# Patient Record
Sex: Female | Born: 1950 | Race: White | Hispanic: No | Smoking: Never smoker
Health system: Southern US, Community
[De-identification: ages and names within clinical notes are randomized; demographics above are authoritative.]

## PROBLEM LIST (undated history)

## (undated) DIAGNOSIS — Z5189 Encounter for other specified aftercare: Secondary | ICD-10-CM

## (undated) DIAGNOSIS — E66811 Obesity, class 1: Secondary | ICD-10-CM

## (undated) DIAGNOSIS — R011 Cardiac murmur, unspecified: Secondary | ICD-10-CM

## (undated) DIAGNOSIS — F330 Major depressive disorder, recurrent, mild: Secondary | ICD-10-CM

## (undated) DIAGNOSIS — Z87442 Personal history of urinary calculi: Secondary | ICD-10-CM

## (undated) DIAGNOSIS — E669 Obesity, unspecified: Secondary | ICD-10-CM

## (undated) DIAGNOSIS — N200 Calculus of kidney: Secondary | ICD-10-CM

## (undated) DIAGNOSIS — E785 Hyperlipidemia, unspecified: Secondary | ICD-10-CM

## (undated) DIAGNOSIS — R519 Headache, unspecified: Secondary | ICD-10-CM

## (undated) DIAGNOSIS — G479 Sleep disorder, unspecified: Secondary | ICD-10-CM

## (undated) DIAGNOSIS — F419 Anxiety disorder, unspecified: Secondary | ICD-10-CM

## (undated) DIAGNOSIS — J301 Allergic rhinitis due to pollen: Secondary | ICD-10-CM

## (undated) DIAGNOSIS — C801 Malignant (primary) neoplasm, unspecified: Secondary | ICD-10-CM

## (undated) DIAGNOSIS — H269 Unspecified cataract: Secondary | ICD-10-CM

## (undated) HISTORY — DX: Obesity, class 1: E66.811

## (undated) HISTORY — DX: Anxiety disorder, unspecified: F41.9

## (undated) HISTORY — DX: Allergic rhinitis due to pollen: J30.1

## (undated) HISTORY — PX: BREAST SURGERY: SHX581

## (undated) HISTORY — DX: Cardiac murmur, unspecified: R01.1

## (undated) HISTORY — PX: APPENDECTOMY: SHX54

## (undated) HISTORY — DX: Major depressive disorder, recurrent, mild: F33.0

## (undated) HISTORY — DX: Hyperlipidemia, unspecified: E78.5

## (undated) HISTORY — DX: Obesity, unspecified: E66.9

## (undated) HISTORY — DX: Encounter for other specified aftercare: Z51.89

## (undated) HISTORY — PX: COLONOSCOPY W/ POLYPECTOMY: SHX1380

## (undated) HISTORY — DX: Sleep disorder, unspecified: G47.9

## (undated) HISTORY — DX: Calculus of kidney: N20.0

## (undated) HISTORY — PX: EYE SURGERY: SHX253

## (undated) HISTORY — DX: Unspecified cataract: H26.9

## (undated) HISTORY — PX: COLON SURGERY: SHX602

---

## 1982-05-02 DIAGNOSIS — Z85038 Personal history of other malignant neoplasm of large intestine: Secondary | ICD-10-CM

## 1982-05-02 DIAGNOSIS — C801 Malignant (primary) neoplasm, unspecified: Secondary | ICD-10-CM

## 1982-05-02 HISTORY — PX: COLON SURGERY: SHX602

## 1982-05-02 HISTORY — DX: Personal history of other malignant neoplasm of large intestine: Z85.038

## 1982-05-02 HISTORY — DX: Malignant (primary) neoplasm, unspecified: C80.1

## 1997-05-02 HISTORY — PX: BREAST SURGERY: SHX581

## 1997-05-02 HISTORY — PX: BREAST EXCISIONAL BIOPSY: SUR124

## 1997-09-03 ENCOUNTER — Other Ambulatory Visit: Admission: RE | Admit: 1997-09-03 | Discharge: 1997-09-03 | Payer: Self-pay | Admitting: Obstetrics and Gynecology

## 1997-09-15 ENCOUNTER — Ambulatory Visit (HOSPITAL_COMMUNITY): Admission: RE | Admit: 1997-09-15 | Discharge: 1997-09-15 | Payer: Self-pay | Admitting: Obstetrics and Gynecology

## 1997-10-23 ENCOUNTER — Ambulatory Visit (HOSPITAL_COMMUNITY): Admission: RE | Admit: 1997-10-23 | Discharge: 1997-10-23 | Payer: Self-pay | Admitting: General Surgery

## 1997-10-31 ENCOUNTER — Ambulatory Visit (HOSPITAL_COMMUNITY): Admission: RE | Admit: 1997-10-31 | Discharge: 1997-10-31 | Payer: Self-pay | Admitting: Obstetrics and Gynecology

## 1997-11-05 ENCOUNTER — Ambulatory Visit (HOSPITAL_COMMUNITY): Admission: RE | Admit: 1997-11-05 | Discharge: 1997-11-05 | Payer: Self-pay | Admitting: General Surgery

## 1998-09-09 ENCOUNTER — Other Ambulatory Visit: Admission: RE | Admit: 1998-09-09 | Discharge: 1998-09-09 | Payer: Self-pay | Admitting: Obstetrics and Gynecology

## 1999-02-05 ENCOUNTER — Ambulatory Visit (HOSPITAL_COMMUNITY): Admission: RE | Admit: 1999-02-05 | Discharge: 1999-02-05 | Payer: Self-pay | Admitting: Gastroenterology

## 1999-10-06 ENCOUNTER — Other Ambulatory Visit: Admission: RE | Admit: 1999-10-06 | Discharge: 1999-10-06 | Payer: Self-pay | Admitting: Obstetrics and Gynecology

## 2000-07-19 ENCOUNTER — Ambulatory Visit (HOSPITAL_COMMUNITY): Admission: RE | Admit: 2000-07-19 | Discharge: 2000-07-19 | Payer: Self-pay

## 2001-06-20 ENCOUNTER — Encounter: Payer: Self-pay | Admitting: Obstetrics and Gynecology

## 2001-06-20 ENCOUNTER — Ambulatory Visit (HOSPITAL_COMMUNITY): Admission: RE | Admit: 2001-06-20 | Discharge: 2001-06-20 | Payer: Self-pay | Admitting: Obstetrics and Gynecology

## 2002-01-30 ENCOUNTER — Encounter: Payer: Self-pay | Admitting: Internal Medicine

## 2002-01-30 ENCOUNTER — Encounter: Admission: RE | Admit: 2002-01-30 | Discharge: 2002-01-30 | Payer: Self-pay | Admitting: Internal Medicine

## 2002-06-20 ENCOUNTER — Ambulatory Visit (HOSPITAL_COMMUNITY): Admission: RE | Admit: 2002-06-20 | Discharge: 2002-06-20 | Payer: Self-pay | Admitting: Gastroenterology

## 2010-09-13 ENCOUNTER — Emergency Department: Payer: Self-pay | Admitting: Emergency Medicine

## 2010-10-29 ENCOUNTER — Ambulatory Visit: Payer: Self-pay | Admitting: Family Medicine

## 2011-06-20 ENCOUNTER — Ambulatory Visit: Payer: Self-pay | Admitting: Internal Medicine

## 2011-06-28 ENCOUNTER — Ambulatory Visit: Payer: Self-pay | Admitting: Urology

## 2011-07-15 ENCOUNTER — Ambulatory Visit: Payer: Self-pay | Admitting: Urology

## 2011-08-16 ENCOUNTER — Ambulatory Visit: Payer: Self-pay | Admitting: Urology

## 2012-11-26 ENCOUNTER — Ambulatory Visit: Payer: Self-pay | Admitting: Family Medicine

## 2013-07-20 ENCOUNTER — Encounter (HOSPITAL_COMMUNITY): Payer: Self-pay | Admitting: Emergency Medicine

## 2013-07-20 ENCOUNTER — Emergency Department (HOSPITAL_COMMUNITY)
Admission: EM | Admit: 2013-07-20 | Discharge: 2013-07-20 | Disposition: A | Discharge status: Home / Self Care | Payer: BC Managed Care – PPO | Attending: Emergency Medicine | Admitting: Emergency Medicine

## 2013-07-20 DIAGNOSIS — S61209A Unspecified open wound of unspecified finger without damage to nail, initial encounter: Secondary | ICD-10-CM | POA: Insufficient documentation

## 2013-07-20 DIAGNOSIS — W268XXA Contact with other sharp object(s), not elsewhere classified, initial encounter: Secondary | ICD-10-CM | POA: Insufficient documentation

## 2013-07-20 DIAGNOSIS — Y9389 Activity, other specified: Secondary | ICD-10-CM | POA: Insufficient documentation

## 2013-07-20 DIAGNOSIS — IMO0002 Reserved for concepts with insufficient information to code with codable children: Secondary | ICD-10-CM

## 2013-07-20 DIAGNOSIS — Y929 Unspecified place or not applicable: Secondary | ICD-10-CM | POA: Insufficient documentation

## 2013-07-20 HISTORY — DX: Malignant (primary) neoplasm, unspecified: C80.1

## 2013-07-20 NOTE — ED Provider Notes (Signed)
CSN: [id number]     Arrival date & time 07/20/13  1125 History   First MD Initiated Contact with Patient 07/20/13 1138     Chief Complaint  Patient presents with  . Extremity Laceration    fingertip of r/hand -index finger, .5 cm lac. Bleeding controlled     (Consider location/radiation/quality/duration/timing/severity/associated sxs/prior Treatment) The history is provided by the patient.     63 y.o. female sustained laceration of finger 2 hours ago. Petra Kuba of injury: patient sliced the tip of her right first finger on a light bulb. . Tetanus vaccination status reviewed: last tetanus booster within 10 years.  Denies numbness tingling, easy bleeding.     Past Medical History  Diagnosis Date  . Cancer    Past Surgical History  Procedure Laterality Date  . Colon surgery    . Breast surgery    . Cesarean section     History reviewed. No pertinent family history. History  Substance Use Topics  . Smoking status: Never Smoker   . Smokeless tobacco: Not on file  . Alcohol Use: Yes   OB History   Grav Para Term Preterm Abortions TAB SAB Ect Mult Living                 Review of Systems  Skin: Positive for wound.  Neurological: Negative for weakness and numbness.  Hematological: Does not bruise/bleed easily.      Allergies  Review of patient's allergies indicates no known allergies.  Home Medications  No current outpatient prescriptions on file. BP 126/94  Pulse 81  Resp 18  SpO2 95% Physical Exam  Constitutional: She is oriented to person, place, and time. She appears well-developed and well-nourished. No distress.  HENT:  Head: Normocephalic and atraumatic.  Eyes: Conjunctivae are normal. No scleral icterus.  Neck: Normal range of motion.  Cardiovascular: Normal rate, regular rhythm and normal heart sounds.  Exam reveals no gallop and no friction rub.   No murmur heard. Pulmonary/Chest: Effort normal and breath sounds normal. No respiratory distress.   Abdominal: Soft. Bowel sounds are normal. She exhibits no distension and no mass. There is no tenderness. There is no guarding.  Musculoskeletal:  0.5 cm superficial laceration of the distal tip of R the index finger. Bleeding is controlled.  Neurological: She is alert and oriented to person, place, and time.  Skin: Skin is warm and dry. She is not diaphoretic.    ED Course  LACERATION REPAIR Date/Time: 07/20/2013 12:54 PM Performed by: Margarita Mail Authorized by: Margarita Mail Consent: Verbal consent obtained. Body area: upper extremity Location details: right index finger Laceration length: 0.5 cm Foreign bodies: no foreign bodies Vascular damage: no Irrigation solution: saline Wound skin closure material used: dermabond.   (including critical care time) Labs Review Labs Reviewed - No data to display Imaging Review No results found.   EKG Interpretation None      MDM   Final diagnoses:  Laceration    Tdap booster given.Pressure irrigation performed. Laceration occurred < 8 hours prior to repair which was well tolerated. Pt has no co morbidities to effect normal wound healing. Discussed adhesive  care w pt and answered questions.Pt is hemodynamically stable w no complaints prior to dc.       Margarita Mail, PA-C 07/21/13 1820

## 2013-07-20 NOTE — ED Notes (Signed)
.  5 cm laceration to tip of index finger on r/hand. Bleeding controlled. Pt reports that she touched a broken light bulb,while reaching into a box

## 2013-07-20 NOTE — Discharge Instructions (Signed)
Tissue Adhesive Wound Care Some cuts, wounds, lacerations, and incisions can be repaired by using tissue adhesive. Tissue adhesive is like glue. It holds the skin together, allowing for faster healing. It forms a strong bond on the skin in about 1 minute and reaches its full strength in about 2 or 3 minutes. The adhesive disappears naturally while the wound is healing. It is important to take proper care of your wound at home while it heals.  HOME CARE INSTRUCTIONS   Showers are allowed. Do not soak the area containing the tissue adhesive. Do not take baths, swim, or use hot tubs. Do not use any soaps or ointments on the wound. Certain ointments can weaken the glue.  If a bandage (dressing) has been applied, follow your health care provider's instructions for how often to change the dressing.   Keep the dressing dry if one has been applied.   Do not scratch, pick, or rub the adhesive.   Do not place tape over the adhesive. The adhesive could come off when pulling the tape off.   Protect the wound from further injury until it is healed.   Protect the wound from sun and tanning bed exposure while it is healing and for several weeks after healing.   Only take over-the-counter or prescription medicines as directed by your health care provider.   Keep all follow-up appointments as directed by your health care provider. SEEK IMMEDIATE MEDICAL CARE IF:   Your wound becomes red, swollen, hot, or tender.   You develop a rash after the glue is applied.  You have increasing pain in the wound.   You have a red streak that goes away from the wound.   You have pus coming from the wound.   You have increased bleeding.  You have a fever.  You have shaking chills.   You notice a bad smell coming from the wound.   Your wound or adhesive breaks open.  MAKE SURE YOU:   Understand these instructions.  Will watch your condition.  Will get help right away if you are not doing  well or get worse. Document Released: 10/12/2000 Document Revised: 02/06/2013 Document Reviewed: 11/07/2012 [facility] Patient Information 2014 [place], [place].

## 2013-07-20 NOTE — ED Notes (Signed)
She states she cut her right index finger pad on a broken lightbulb.  She states "It just wouldn't quit bleeding".  She is in no distress and is holding a clean napkin on it, which I do not disturb.

## 2013-07-23 NOTE — ED Provider Notes (Signed)
Medical screening examination/treatment/procedure(s) were performed by non-physician practitioner and as supervising physician I was immediately available for consultation/collaboration.   EKG Interpretation None        Hoy Morn, MD 07/23/13 [id number]

## 2014-05-08 DIAGNOSIS — F330 Major depressive disorder, recurrent, mild: Secondary | ICD-10-CM | POA: Insufficient documentation

## 2017-02-17 LAB — HM HIV SCREENING LAB: HM HIV Screening: NEGATIVE

## 2017-02-17 LAB — HM HEPATITIS C SCREENING LAB: HM Hepatitis Screen: NEGATIVE

## 2017-02-24 DIAGNOSIS — E7800 Pure hypercholesterolemia, unspecified: Secondary | ICD-10-CM | POA: Insufficient documentation

## 2017-03-01 LAB — HM DEXA SCAN

## 2017-03-02 DIAGNOSIS — M8589 Other specified disorders of bone density and structure, multiple sites: Secondary | ICD-10-CM | POA: Insufficient documentation

## 2017-05-26 ENCOUNTER — Other Ambulatory Visit: Payer: Self-pay | Admitting: Family Medicine

## 2017-05-26 DIAGNOSIS — Z1231 Encounter for screening mammogram for malignant neoplasm of breast: Secondary | ICD-10-CM

## 2018-06-21 DIAGNOSIS — F341 Dysthymic disorder: Secondary | ICD-10-CM | POA: Diagnosis not present

## 2018-07-16 DIAGNOSIS — Z86010 Personal history of colonic polyps: Secondary | ICD-10-CM | POA: Diagnosis not present

## 2018-07-16 DIAGNOSIS — Z85038 Personal history of other malignant neoplasm of large intestine: Secondary | ICD-10-CM | POA: Diagnosis not present

## 2018-07-16 DIAGNOSIS — K635 Polyp of colon: Secondary | ICD-10-CM | POA: Diagnosis not present

## 2018-07-16 DIAGNOSIS — K640 First degree hemorrhoids: Secondary | ICD-10-CM | POA: Diagnosis not present

## 2018-07-16 LAB — HM COLONOSCOPY

## 2018-07-18 DIAGNOSIS — K635 Polyp of colon: Secondary | ICD-10-CM | POA: Diagnosis not present

## 2018-10-03 ENCOUNTER — Other Ambulatory Visit: Payer: Self-pay | Admitting: Family Medicine

## 2018-10-03 DIAGNOSIS — Z1231 Encounter for screening mammogram for malignant neoplasm of breast: Secondary | ICD-10-CM

## 2018-10-09 DIAGNOSIS — Z711 Person with feared health complaint in whom no diagnosis is made: Secondary | ICD-10-CM | POA: Diagnosis not present

## 2018-10-09 DIAGNOSIS — Z6830 Body mass index (BMI) 30.0-30.9, adult: Secondary | ICD-10-CM | POA: Insufficient documentation

## 2018-10-09 DIAGNOSIS — E6609 Other obesity due to excess calories: Secondary | ICD-10-CM | POA: Diagnosis not present

## 2018-10-09 DIAGNOSIS — F330 Major depressive disorder, recurrent, mild: Secondary | ICD-10-CM | POA: Diagnosis not present

## 2018-10-09 DIAGNOSIS — E7800 Pure hypercholesterolemia, unspecified: Secondary | ICD-10-CM | POA: Diagnosis not present

## 2018-10-10 DIAGNOSIS — E7800 Pure hypercholesterolemia, unspecified: Secondary | ICD-10-CM | POA: Diagnosis not present

## 2018-10-10 DIAGNOSIS — F418 Other specified anxiety disorders: Secondary | ICD-10-CM | POA: Diagnosis not present

## 2018-10-10 DIAGNOSIS — Z131 Encounter for screening for diabetes mellitus: Secondary | ICD-10-CM | POA: Diagnosis not present

## 2018-10-10 DIAGNOSIS — Z0000 Encounter for general adult medical examination without abnormal findings: Secondary | ICD-10-CM | POA: Diagnosis not present

## 2018-10-22 DIAGNOSIS — Z0000 Encounter for general adult medical examination without abnormal findings: Secondary | ICD-10-CM | POA: Diagnosis not present

## 2018-10-22 DIAGNOSIS — E6609 Other obesity due to excess calories: Secondary | ICD-10-CM | POA: Diagnosis not present

## 2018-10-22 DIAGNOSIS — E7800 Pure hypercholesterolemia, unspecified: Secondary | ICD-10-CM | POA: Diagnosis not present

## 2018-10-22 DIAGNOSIS — R030 Elevated blood-pressure reading, without diagnosis of hypertension: Secondary | ICD-10-CM | POA: Diagnosis not present

## 2018-10-25 DIAGNOSIS — F418 Other specified anxiety disorders: Secondary | ICD-10-CM | POA: Diagnosis not present

## 2018-11-06 ENCOUNTER — Encounter: Payer: Self-pay | Admitting: Obstetrics & Gynecology

## 2018-11-15 ENCOUNTER — Ambulatory Visit
Admission: RE | Admit: 2018-11-15 | Discharge: 2018-11-15 | Disposition: A | Discharge status: Home / Self Care | Payer: BC Managed Care – PPO | Source: Ambulatory Visit | Attending: Family Medicine | Admitting: Family Medicine

## 2018-11-15 ENCOUNTER — Other Ambulatory Visit: Payer: Self-pay

## 2018-11-15 DIAGNOSIS — C801 Malignant (primary) neoplasm, unspecified: Secondary | ICD-10-CM | POA: Insufficient documentation

## 2018-11-15 DIAGNOSIS — Z1231 Encounter for screening mammogram for malignant neoplasm of breast: Secondary | ICD-10-CM | POA: Insufficient documentation

## 2018-11-19 DIAGNOSIS — F418 Other specified anxiety disorders: Secondary | ICD-10-CM | POA: Diagnosis not present

## 2018-12-11 DIAGNOSIS — F418 Other specified anxiety disorders: Secondary | ICD-10-CM | POA: Diagnosis not present

## 2018-12-27 DIAGNOSIS — F418 Other specified anxiety disorders: Secondary | ICD-10-CM | POA: Diagnosis not present

## 2019-01-11 DIAGNOSIS — D225 Melanocytic nevi of trunk: Secondary | ICD-10-CM | POA: Diagnosis not present

## 2019-01-11 DIAGNOSIS — L986 Other infiltrative disorders of the skin and subcutaneous tissue: Secondary | ICD-10-CM | POA: Diagnosis not present

## 2019-01-11 DIAGNOSIS — L821 Other seborrheic keratosis: Secondary | ICD-10-CM | POA: Diagnosis not present

## 2019-01-11 DIAGNOSIS — D485 Neoplasm of uncertain behavior of skin: Secondary | ICD-10-CM | POA: Diagnosis not present

## 2019-01-11 DIAGNOSIS — D3611 Benign neoplasm of peripheral nerves and autonomic nervous system of face, head, and neck: Secondary | ICD-10-CM | POA: Diagnosis not present

## 2019-01-11 DIAGNOSIS — D2262 Melanocytic nevi of left upper limb, including shoulder: Secondary | ICD-10-CM | POA: Diagnosis not present

## 2019-01-21 DIAGNOSIS — F418 Other specified anxiety disorders: Secondary | ICD-10-CM | POA: Diagnosis not present

## 2019-01-22 DIAGNOSIS — R5383 Other fatigue: Secondary | ICD-10-CM | POA: Diagnosis not present

## 2019-01-22 DIAGNOSIS — E7800 Pure hypercholesterolemia, unspecified: Secondary | ICD-10-CM | POA: Diagnosis not present

## 2019-01-22 DIAGNOSIS — E6609 Other obesity due to excess calories: Secondary | ICD-10-CM | POA: Diagnosis not present

## 2019-01-30 DIAGNOSIS — E7800 Pure hypercholesterolemia, unspecified: Secondary | ICD-10-CM | POA: Diagnosis not present

## 2019-01-30 DIAGNOSIS — Z6830 Body mass index (BMI) 30.0-30.9, adult: Secondary | ICD-10-CM | POA: Diagnosis not present

## 2019-01-30 DIAGNOSIS — E6609 Other obesity due to excess calories: Secondary | ICD-10-CM | POA: Diagnosis not present

## 2019-01-30 DIAGNOSIS — F330 Major depressive disorder, recurrent, mild: Secondary | ICD-10-CM | POA: Diagnosis not present

## 2019-02-21 DIAGNOSIS — F418 Other specified anxiety disorders: Secondary | ICD-10-CM | POA: Diagnosis not present

## 2019-03-06 ENCOUNTER — Ambulatory Visit: Payer: Self-pay

## 2019-03-06 ENCOUNTER — Other Ambulatory Visit: Payer: Self-pay

## 2019-03-06 DIAGNOSIS — Z23 Encounter for immunization: Secondary | ICD-10-CM

## 2019-04-16 ENCOUNTER — Other Ambulatory Visit: Payer: Self-pay

## 2019-04-16 ENCOUNTER — Telehealth: Payer: Self-pay | Admitting: Medical

## 2019-04-16 DIAGNOSIS — M25562 Pain in left knee: Secondary | ICD-10-CM

## 2019-04-16 DIAGNOSIS — S8002XA Contusion of left knee, initial encounter: Secondary | ICD-10-CM | POA: Diagnosis not present

## 2019-04-16 NOTE — Progress Notes (Signed)
Permission to do a telemedicine visit. Patient 68 yo female in non acute distress. Calls today with left knee pain. She was on the tailgate of a truck and jumped down.she did not flex her knees and "jolted" them upon landing. The left knee is now painful to walk. Currently she is sitting with legs elevated with ice on the left knee. She rates her pain as a 4 /10 and she states she has a high tolerance for pain.No worsening of pain on straightening or flexing knee. But is it is painful to bear weight.   In reviewing her record she does have a history of Osteopenia and a history of colon cancer. I recommended she get an x-ray of the left knee. Given patient the option of me ordering or for her  to be seen by [facility]. She verbalizes to me that she would like to go to [facility] walk in clinic. I reviewed with the patient the location of the [facility] clinic.  She is concerned about picking up a grand child form daycare at  4pm, I discussed with the patient to let them know at the clinic.  She verbalizes understanding and has no questions at the end of our conversation.

## 2019-04-17 ENCOUNTER — Telehealth: Payer: Self-pay | Admitting: Medical

## 2019-04-17 NOTE — Telephone Encounter (Signed)
Called and left voice message for patient to call office, given office number.

## 2019-04-17 NOTE — Telephone Encounter (Signed)
Called "Audrey James" to see if she went to [facility].  She did go. x-rays were negative, she was told she has a bruised bone and that it may take up to 2 weeks to heal. She was given a knee brace to give support since she is doing alot during the Christmas season.  She also emailed me a nice note about her diagnosis and treatment..  She may return to this clinic as needed.  She verbalizes undersaanding and has no questions at the end of our conversation.

## 2019-04-22 DIAGNOSIS — F418 Other specified anxiety disorders: Secondary | ICD-10-CM | POA: Diagnosis not present

## 2019-05-17 DIAGNOSIS — F418 Other specified anxiety disorders: Secondary | ICD-10-CM | POA: Diagnosis not present

## 2019-05-22 DIAGNOSIS — M545 Low back pain: Secondary | ICD-10-CM | POA: Diagnosis not present

## 2019-05-22 DIAGNOSIS — R8271 Bacteriuria: Secondary | ICD-10-CM | POA: Diagnosis not present

## 2019-06-08 DIAGNOSIS — L508 Other urticaria: Secondary | ICD-10-CM | POA: Diagnosis not present

## 2019-06-11 DIAGNOSIS — L508 Other urticaria: Secondary | ICD-10-CM | POA: Diagnosis not present

## 2019-06-24 ENCOUNTER — Ambulatory Visit: Payer: BC Managed Care – PPO | Attending: Internal Medicine

## 2019-06-24 DIAGNOSIS — Z23 Encounter for immunization: Secondary | ICD-10-CM | POA: Insufficient documentation

## 2019-06-24 NOTE — Progress Notes (Signed)
   Covid-19 Vaccination Clinic  Name:  RAENA ORTLIP    MRN: [id number] DOB: Sep 28, 1950  06/24/2019  Ms. Ramone was observed post Covid-19 immunization for 15 minutes without incidence. She was provided with Vaccine Information Sheet and instruction to access the V-Safe system.   Ms. Barnwell was instructed to call 911 with any severe reactions post vaccine: Marland Kitchen Difficulty breathing  . Swelling of your face and throat  . A fast heartbeat  . A bad rash all over your body  . Dizziness and weakness    Immunizations Administered    Name Date Dose VIS Date Route   Moderna COVID-19 Vaccine 06/24/2019  1:14 PM 0.5 mL 04/02/2019 Intramuscular   Manufacturer: Moderna   Lot: [id number]   [id number]

## 2019-07-23 ENCOUNTER — Ambulatory Visit: Payer: Self-pay | Attending: Internal Medicine

## 2019-07-23 ENCOUNTER — Other Ambulatory Visit: Payer: Self-pay

## 2019-07-23 DIAGNOSIS — Z23 Encounter for immunization: Secondary | ICD-10-CM

## 2019-07-23 NOTE — Progress Notes (Signed)
   Covid-19 Vaccination Clinic  Name:  Audrey James    MRN: [id number] DOB: 03/29/1951  07/23/2019  [name] was observed post Covid-19 immunization for 15 minutes without incident. She was provided with Vaccine Information Sheet and instruction to access the V-Safe system.   [name] was instructed to call 911 with any severe reactions post vaccine: Marland Kitchen Difficulty breathing  . Swelling of face and throat  . A fast heartbeat  . A bad rash all over body  . Dizziness and weakness   Immunizations Administered    Name Date Dose VIS Date Route   Moderna COVID-19 Vaccine 07/23/2019  1:36 PM 0.5 mL 04/02/2019 Intramuscular   Manufacturer: Levan Hurst   [id number]   [place]:[id number]

## 2019-08-12 DIAGNOSIS — F418 Other specified anxiety disorders: Secondary | ICD-10-CM | POA: Diagnosis not present

## 2019-08-28 ENCOUNTER — Encounter: Payer: Self-pay | Admitting: Internal Medicine

## 2019-08-28 ENCOUNTER — Ambulatory Visit: Payer: BC Managed Care – PPO | Admitting: Internal Medicine

## 2019-08-28 ENCOUNTER — Other Ambulatory Visit: Payer: Self-pay

## 2019-08-28 VITALS — BP 114/72 | HR 70 | Temp 98.0°F | Ht 67.0 in | Wt 196.0 lb

## 2019-08-28 DIAGNOSIS — G479 Sleep disorder, unspecified: Secondary | ICD-10-CM | POA: Diagnosis not present

## 2019-08-28 DIAGNOSIS — E669 Obesity, unspecified: Secondary | ICD-10-CM | POA: Diagnosis not present

## 2019-08-28 DIAGNOSIS — F330 Major depressive disorder, recurrent, mild: Secondary | ICD-10-CM

## 2019-08-28 DIAGNOSIS — J301 Allergic rhinitis due to pollen: Secondary | ICD-10-CM

## 2019-08-28 DIAGNOSIS — Z23 Encounter for immunization: Secondary | ICD-10-CM

## 2019-08-28 DIAGNOSIS — E785 Hyperlipidemia, unspecified: Secondary | ICD-10-CM | POA: Diagnosis not present

## 2019-08-28 DIAGNOSIS — Z85038 Personal history of other malignant neoplasm of large intestine: Secondary | ICD-10-CM | POA: Diagnosis not present

## 2019-08-28 LAB — COMPREHENSIVE METABOLIC PANEL
ALT: 18 U/L (ref 0–35)
AST: 20 U/L (ref 0–37)
Albumin: 4.3 g/dL (ref 3.5–5.2)
Alkaline Phosphatase: 72 U/L (ref 39–117)
BUN: 14 mg/dL (ref 6–23)
CO2: 30 mEq/L (ref 19–32)
Calcium: 9.3 mg/dL (ref 8.4–10.5)
Chloride: 104 mEq/L (ref 96–112)
Creatinine, Ser: 0.93 mg/dL (ref 0.40–1.20)
GFR: 60 mL/min — ABNORMAL LOW (ref >60.00)
Glucose, Bld: 97 mg/dL (ref 70–99)
Potassium: 4.4 mEq/L (ref 3.5–5.1)
Sodium: 139 mEq/L (ref 135–145)
Total Bilirubin: 0.5 mg/dL (ref 0.2–1.2)
Total Protein: 7.1 g/dL (ref 6.0–8.3)

## 2019-08-28 LAB — LIPID PANEL
Cholesterol: 191 mg/dL (ref 0–200)
HDL: 39.9 mg/dL (ref >39.00)
NonHDL: 151.35
Total CHOL/HDL Ratio: 5
Triglycerides: 205 mg/dL — ABNORMAL HIGH (ref 0.0–149.0)
VLDL: 41 mg/dL — ABNORMAL HIGH (ref 0.0–40.0)

## 2019-08-28 LAB — T4, FREE: Free T4: 1.25 ng/dL (ref 0.60–1.60)

## 2019-08-28 LAB — LDL CHOLESTEROL, DIRECT: Direct LDL: 111 mg/dL

## 2019-08-28 NOTE — Assessment & Plan Note (Signed)
Discussed sleep contraction---don't go to bed till 10:30 and slowly increase time if doing better Consider melatonin or trazodone

## 2019-08-28 NOTE — Assessment & Plan Note (Signed)
Zyrtec helps Uses 2 zyrtec and zantac if hives

## 2019-08-28 NOTE — Patient Instructions (Signed)
DASH Eating Plan DASH stands for "Dietary Approaches to Stop Hypertension." The DASH eating plan is a healthy eating plan that has been shown to reduce high blood pressure (hypertension). It may also reduce your risk for type 2 diabetes, heart disease, and stroke. The DASH eating plan may also help with weight loss. What are tips for following this plan?  General guidelines  Avoid eating more than 2,300 mg (milligrams) of salt (sodium) a day. If you have hypertension, you may need to reduce your sodium intake to 1,500 mg a day.  Limit alcohol intake to no more than 1 drink a day for nonpregnant women and 2 drinks a day for men. One drink equals 12 oz of beer, 5 oz of wine, or 1 oz of hard liquor.  Work with your health care provider to maintain a healthy body weight or to lose weight. Ask what an ideal weight is for you.  Get at least 30 minutes of exercise that causes your heart to beat faster (aerobic exercise) most days of the week. Activities may include walking, swimming, or biking.  Work with your health care provider or diet and nutrition specialist (dietitian) to adjust your eating plan to your individual calorie needs. Reading food labels   Check food labels for the amount of sodium per serving. Choose foods with less than 5 percent of the Daily Value of sodium. Generally, foods with less than 300 mg of sodium per serving fit into this eating plan.  To find whole grains, look for the word "whole" as the first word in the ingredient list. Shopping  Buy products labeled as "low-sodium" or "no salt added."  Buy fresh foods. Avoid canned foods and premade or frozen meals. Cooking  Avoid adding salt when cooking. Use salt-free seasonings or herbs instead of table salt or sea salt. Check with your health care provider or pharmacist before using salt substitutes.  Do not fry foods. Cook foods using healthy methods such as baking, boiling, grilling, and broiling instead.  Cook with  heart-healthy oils, such as olive, canola, soybean, or sunflower oil. Meal planning  Eat a balanced diet that includes: ? 5 or more servings of fruits and vegetables each day. At each meal, try to fill half of your plate with fruits and vegetables. ? Up to 6-8 servings of whole grains each day. ? Less than 6 oz of lean meat, poultry, or fish each day. A 3-oz serving of meat is about the same size as a deck of cards. One egg equals 1 oz. ? 2 servings of low-fat dairy each day. ? A serving of nuts, seeds, or beans 5 times each week. ? Heart-healthy fats. Healthy fats called Omega-3 fatty acids are found in foods such as flaxseeds and coldwater fish, like sardines, salmon, and mackerel.  Limit how much you eat of the following: ? Canned or prepackaged foods. ? Food that is high in trans fat, such as fried foods. ? Food that is high in saturated fat, such as fatty meat. ? Sweets, desserts, sugary drinks, and other foods with added sugar. ? Full-fat dairy products.  Do not salt foods before eating.  Try to eat at least 2 vegetarian meals each week.  Eat more home-cooked food and less restaurant, buffet, and fast food.  When eating at a restaurant, ask that your food be prepared with less salt or no salt, if possible. What foods are recommended? The items listed may not be a complete list. Talk with your dietitian about   what dietary choices are best for you. Grains Whole-grain or whole-wheat bread. Whole-grain or whole-wheat pasta. Brown rice. Oatmeal. Quinoa. Bulgur. Whole-grain and low-sodium cereals. Pita bread. Low-fat, low-sodium crackers. Whole-wheat flour tortillas. Vegetables Fresh or frozen vegetables (raw, steamed, roasted, or grilled). Low-sodium or reduced-sodium tomato and vegetable juice. Low-sodium or reduced-sodium tomato sauce and tomato paste. Low-sodium or reduced-sodium canned vegetables. Fruits All fresh, dried, or frozen fruit. Canned fruit in natural juice (without  added sugar). Meat and other protein foods Skinless chicken or turkey. Ground chicken or turkey. Pork with fat trimmed off. Fish and seafood. Egg whites. Dried beans, peas, or lentils. Unsalted nuts, nut butters, and seeds. Unsalted canned beans. Lean cuts of beef with fat trimmed off. Low-sodium, lean deli meat. Dairy Low-fat (1%) or fat-free (skim) milk. Fat-free, low-fat, or reduced-fat cheeses. Nonfat, low-sodium ricotta or cottage cheese. Low-fat or nonfat yogurt. Low-fat, low-sodium cheese. Fats and oils Soft margarine without trans fats. Vegetable oil. Low-fat, reduced-fat, or light mayonnaise and salad dressings (reduced-sodium). Canola, safflower, olive, soybean, and sunflower oils. Avocado. Seasoning and other foods Herbs. Spices. Seasoning mixes without salt. Unsalted popcorn and pretzels. Fat-free sweets. What foods are not recommended? The items listed may not be a complete list. Talk with your dietitian about what dietary choices are best for you. Grains Baked goods made with fat, such as croissants, muffins, or some breads. Dry pasta or rice meal packs. Vegetables Creamed or fried vegetables. Vegetables in a cheese sauce. Regular canned vegetables (not low-sodium or reduced-sodium). Regular canned tomato sauce and paste (not low-sodium or reduced-sodium). Regular tomato and vegetable juice (not low-sodium or reduced-sodium). Pickles. Olives. Fruits Canned fruit in a light or heavy syrup. Fried fruit. Fruit in cream or butter sauce. Meat and other protein foods Fatty cuts of meat. Ribs. Fried meat. Bacon. Sausage. Bologna and other processed lunch meats. Salami. Fatback. Hotdogs. Bratwurst. Salted nuts and seeds. Canned beans with added salt. Canned or smoked fish. Whole eggs or egg yolks. Chicken or turkey with skin. Dairy Whole or 2% milk, cream, and half-and-half. Whole or full-fat cream cheese. Whole-fat or sweetened yogurt. Full-fat cheese. Nondairy creamers. Whipped toppings.  Processed cheese and cheese spreads. Fats and oils Butter. Stick margarine. Lard. Shortening. Ghee. Bacon fat. Tropical oils, such as coconut, palm kernel, or palm oil. Seasoning and other foods Salted popcorn and pretzels. Onion salt, garlic salt, seasoned salt, table salt, and sea salt. Worcestershire sauce. Tartar sauce. Barbecue sauce. Teriyaki sauce. Soy sauce, including reduced-sodium. Steak sauce. Canned and packaged gravies. Fish sauce. Oyster sauce. Cocktail sauce. Horseradish that you find on the shelf. Ketchup. Mustard. Meat flavorings and tenderizers. Bouillon cubes. Hot sauce and Tabasco sauce. Premade or packaged marinades. Premade or packaged taco seasonings. Relishes. Regular salad dressings. Where to find more information:  National Heart, Lung, and Blood Institute: www.nhlbi.nih.gov  American Heart Association: www.heart.org Summary  The DASH eating plan is a healthy eating plan that has been shown to reduce high blood pressure (hypertension). It may also reduce your risk for type 2 diabetes, heart disease, and stroke.  With the DASH eating plan, you should limit salt (sodium) intake to 2,300 mg a day. If you have hypertension, you may need to reduce your sodium intake to 1,500 mg a day.  When on the DASH eating plan, aim to eat more fresh fruits and vegetables, whole grains, lean proteins, low-fat dairy, and heart-healthy fats.  Work with your health care provider or diet and nutrition specialist (dietitian) to adjust your eating plan to your   individual calorie needs. This information is not intended to replace advice given to you by your health care provider. Make sure you discuss any questions you have with your health care provider. Document Revised: 03/31/2017 Document Reviewed: 04/11/2016 Elsevier Patient Education  2020 Elsevier Inc.  

## 2019-08-28 NOTE — Assessment & Plan Note (Signed)
Discussed exercise DASH plan

## 2019-08-28 NOTE — Addendum Note (Signed)
Addended by: Pilar Grammes on: 08/28/2019 12:41 PM   Modules accepted: Orders

## 2019-08-28 NOTE — Assessment & Plan Note (Signed)
Chronic life long depression Also with anxiety Discussed that she should continue her med without stopping in summer Past abilify for augmentation--doesn't need now Would consider wellbutrin if needed (??in fall)

## 2019-08-28 NOTE — Assessment & Plan Note (Addendum)
Will reassess  Dad did have MI but not very young Might consider statin again--but bad aching with pravastatin and another one

## 2019-08-28 NOTE — Progress Notes (Signed)
Subjective:    Patient ID: Audrey James, female    DOB: 1951-04-29, 69 y.o.   MRN: [id number]  HPI Here to establish care This visit occurred during the SARS-CoV-2 public health emergency.  Safety protocols were in place, including screening questions prior to the visit, additional usage of staff PPE, and extensive cleaning of exam room while observing appropriate contact time as indicated for disinfecting solutions.   Has been at [facility] Some concerns about finding someone to listen to her Ongoing sleep problems--mostly initiation Concerned about sleep aides--uses OTC at time (sleep ease?) Gets up at 6AM daily---but uses snooze In bed by 9PM--usually asleep by 10  Still fighting anxiety and depression Anxiety worse now--especially with the pandemic Daughter has child now and pregnant---good but more to worry about Picks up granddaughter every day On lexapro for 1-1.5 years Tends to go off meds in summer--doing well. Then recurs in cooler weather  Trying to walk regularly--mostly with granddaughter (in stroller)  Known high cholesterol Not on Rx  Concerned about her weight BMI ~31  Current Outpatient Medications on File Prior to Visit  Medication Sig Dispense Refill  . cetirizine (ZYRTEC) 10 MG tablet Take 10 mg by mouth daily.    Marland Kitchen escitalopram (LEXAPRO) 10 MG tablet Take by mouth.     No current facility-administered medications on file prior to visit.    Allergies  Allergen Reactions  . Cefaclor Hives    Past Medical History:  Diagnosis Date  . colon cancer 1984  . History of colon cancer 1984  . Hyperlipidemia   . MDD (major depressive disorder), recurrent episode, mild ([place])   . Obesity (BMI 30.0-34.9)   . Sleep disorder     Past Surgical History:  Procedure Laterality Date  . BREAST EXCISIONAL BIOPSY Right 1999   neg  . BREAST SURGERY  1999   Biopsy--negative  . CESAREAN SECTION    . COLON SURGERY  1984   Partial colectomy    Family  History  Problem Relation Age of Onset  . Ovarian cancer Maternal Aunt 60  . Dementia Mother   . Hypertension Mother   . Diabetes Mother   . Heart disease Father   . Cancer Paternal Grandmother   . Colon cancer Paternal Grandfather     Social History   Socioeconomic History  . Marital status: Divorced    Spouse name: Not on file  . Number of children: 1  . Years of education: Not on file  . Highest education level: Not on file  Occupational History  . Occupation: Art therapist: [employer]  . 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 Smoking status: Never Smoker  . Smokeless tobacco: Never Used  Substance and Sexual Activity  . Alcohol use: Yes    Comment: rare glass of wine  . Drug use: Not on file  . Sexual activity: Not on file  Other Topics Concern  . Not on file  Social History Narrative   Has living will   Daughter is health care POA   Would accept resuscitation   Not sure tube feeds   Social Determinants of Health   Financial Resource Strain:   . Difficulty of Paying Living Expenses:   Food Insecurity:   . Worried About Charity fundraiser in the Last Year:   . Arboriculturist in the Last Year:   Transportation Needs:   . Film/video editor (Medical):   Marland Kitchen  Lack of Transportation (Non-Medical):   Physical Activity:   . Days of Exercise per Week:   . Minutes of Exercise per Session:   Stress:   . Feeling of Stress :   Social Connections:   . Frequency of Communication with Friends and Family:   . Frequency of Social Gatherings with Friends and Family:   . Attends Religious Services:   . Active Member of Clubs or Organizations:   . Attends Archivist Meetings:   Marland Kitchen Marital Status:   Intimate Partner Violence:   . Fear of Current or Ex-Partner:   . Emotionally Abused:   Marland Kitchen Physically Abused:   . Sexually Abused:    Review of Systems  Constitutional:       Mild decrease in energy levels Wears seat  belt Slight weight gain  HENT: Negative for hearing loss.   Eyes: Negative for visual disturbance.  Respiratory: Negative for cough and shortness of breath.   Cardiovascular: Negative for chest pain, palpitations and leg swelling.  Gastrointestinal: Negative for blood in stool and constipation.       No heartburn   Genitourinary: Negative for dysuria and hematuria.       Occ UTI  Musculoskeletal: Negative for arthralgias, back pain and joint swelling.  Skin: Negative for rash.       Keeps up with derm  Allergic/Immunologic: Positive for environmental allergies. Negative for immunocompromised state.  Neurological: Positive for headaches. Negative for dizziness, syncope and light-headedness.  Hematological: Negative for adenopathy.  Psychiatric/Behavioral: Positive for dysphoric mood and sleep disturbance. The patient is nervous/anxious.        Objective:   Physical Exam         Assessment & Plan:

## 2019-08-29 LAB — CBC
HCT: 40.3 % (ref 36.0–46.0)
Hemoglobin: 13.5 g/dL (ref 12.0–15.0)
MCHC: 33.6 g/dL (ref 30.0–36.0)
MCV: 88.2 fl (ref 78.0–100.0)
Platelets: 256 10*3/uL (ref 150.0–400.0)
RBC: 4.57 Mil/uL (ref 3.87–5.11)
RDW: 13.1 % (ref 11.5–15.5)
WBC: 5.9 10*3/uL (ref 4.0–10.5)

## 2019-09-27 DIAGNOSIS — F418 Other specified anxiety disorders: Secondary | ICD-10-CM | POA: Diagnosis not present

## 2019-10-17 MED ORDER — ESCITALOPRAM OXALATE 10 MG PO TABS: 10.0000 mg | ORAL_TABLET | Freq: Every day | ORAL | 3 refills | Status: DC

## 2019-10-17 NOTE — Telephone Encounter (Signed)
Rx sent electronically.  

## 2019-11-19 DIAGNOSIS — F418 Other specified anxiety disorders: Secondary | ICD-10-CM | POA: Diagnosis not present

## 2019-11-20 DIAGNOSIS — R300 Dysuria: Secondary | ICD-10-CM | POA: Diagnosis not present

## 2019-11-20 DIAGNOSIS — R350 Frequency of micturition: Secondary | ICD-10-CM | POA: Diagnosis not present

## 2019-12-05 DIAGNOSIS — F418 Other specified anxiety disorders: Secondary | ICD-10-CM | POA: Diagnosis not present

## 2020-02-13 DIAGNOSIS — F418 Other specified anxiety disorders: Secondary | ICD-10-CM | POA: Diagnosis not present

## 2020-02-27 ENCOUNTER — Other Ambulatory Visit: Payer: Self-pay

## 2020-02-27 ENCOUNTER — Ambulatory Visit (INDEPENDENT_AMBULATORY_CARE_PROVIDER_SITE_OTHER): Payer: BC Managed Care – PPO | Admitting: Internal Medicine

## 2020-02-27 ENCOUNTER — Encounter: Payer: Self-pay | Admitting: Internal Medicine

## 2020-02-27 VITALS — BP 104/86 | HR 90 | Temp 97.6°F | Ht 67.0 in | Wt 192.0 lb

## 2020-02-27 DIAGNOSIS — Z85038 Personal history of other malignant neoplasm of large intestine: Secondary | ICD-10-CM

## 2020-02-27 DIAGNOSIS — Z23 Encounter for immunization: Secondary | ICD-10-CM | POA: Diagnosis not present

## 2020-02-27 DIAGNOSIS — G479 Sleep disorder, unspecified: Secondary | ICD-10-CM | POA: Diagnosis not present

## 2020-02-27 DIAGNOSIS — Z0000 Encounter for general adult medical examination without abnormal findings: Secondary | ICD-10-CM

## 2020-02-27 DIAGNOSIS — F330 Major depressive disorder, recurrent, mild: Secondary | ICD-10-CM | POA: Diagnosis not present

## 2020-02-27 MED ORDER — ESCITALOPRAM OXALATE 20 MG PO TABS: 20.0000 mg | ORAL_TABLET | Freq: Every day | ORAL | 3 refills | Status: DC

## 2020-02-27 NOTE — Assessment & Plan Note (Signed)
Some recent change in bowel habits---if they persist will need referral for new GI doctor

## 2020-02-27 NOTE — Assessment & Plan Note (Signed)
Better with melatonin

## 2020-02-27 NOTE — Assessment & Plan Note (Signed)
Healthy Discussed increasing exercise shingrix today Mammogram due next July Colon due 2023

## 2020-02-27 NOTE — Assessment & Plan Note (Signed)
Has had some worsening Will increase the lexapro Consider adding other agent as augmentation

## 2020-02-27 NOTE — Addendum Note (Signed)
Addended by: Pilar Grammes on: 02/27/2020 04:35 PM   Modules accepted: Orders

## 2020-02-27 NOTE — Progress Notes (Signed)
Subjective:    Patient ID: Audrey James, female    DOB: Jan 05, 1951, 69 y.o.   MRN: [id number]  HPI  Here for physical This visit occurred during the SARS-CoV-2 public health emergency.  Safety protocols were in place, including screening questions prior to the visit, additional usage of staff PPE, and extensive cleaning of exam room while observing appropriate contact time as indicated for disinfecting solutions.   Has been taking the lexapro every day Speaking to her therapist---"I just feel like I am just existing". 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 Does think about her age and "the best years of my life are behind me" Some family stress---doesn't get along with son in law, and now they are having marital problems (and just had 2nd child)  3 weeks of digestive issues---loose stools every day 3-4 times a day but small Generally had hard stools Some urgency No blood---different stool color though No abdominal pain No weight loss  Current Outpatient Medications on File Prior to Visit  Medication Sig Dispense Refill  . cetirizine (ZYRTEC) 10 MG tablet Take 10 mg by mouth daily.    Marland Kitchen escitalopram (LEXAPRO) 10 MG tablet Take 1 tablet (10 mg total) by mouth daily. 90 tablet 3   No current facility-administered medications on file prior to visit.    Allergies  Allergen Reactions  . Cefaclor Hives    Past Medical History:  Diagnosis Date  . Chronic seasonal allergic rhinitis due to pollen    and hives at times  . colon cancer 1984  . History of colon cancer 1984  . Hyperlipidemia   . MDD (major depressive disorder), recurrent episode, mild ([place])   . Obesity (BMI 30.0-34.9)   . Sleep disorder     Past Surgical History:  Procedure Laterality Date  . BREAST EXCISIONAL BIOPSY Right 1999    neg  . BREAST SURGERY  1999   Biopsy--negative  . CESAREAN SECTION    . COLON SURGERY  1984   Partial colectomy    Family History  Problem Relation Age of Onset  . Ovarian cancer Maternal Aunt 18  . Dementia Mother   . Hypertension Mother   . Diabetes Mother   . Heart disease Father   . Cancer Paternal Grandmother   . Colon cancer Paternal Grandfather     Social History   Socioeconomic History  . Marital status: Divorced    Spouse name: Not on file  . Number of children: 1  . Years of education: Not on file  . Highest education level: Not on file  Occupational History  . Occupation: Art therapist: [employer]  . 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 Smoking status: Never Smoker  . Smokeless tobacco: Never Used  Substance and Sexual Activity  . Alcohol use: Yes    Comment: rare glass of wine  . Drug use: Not on file  . Sexual activity: Not on file  Other Topics Concern  . Not on file  Social History Narrative   Has living will   Daughter is health care POA   Would accept resuscitation   Not sure tube feeds   Social Determinants of Health   Financial Resource Strain:   . Difficulty of Paying Living Expenses: Not on  file  Food Insecurity:   . Worried About Charity fundraiser in the Last Year: Not on file  . Ran Out of Food in the Last Year: Not on file  Transportation Needs:   . Lack of Transportation (Medical): Not on file  . Lack of Transportation (Non-Medical): Not on file  Physical Activity:   . Days of Exercise per Week: Not on file  . Minutes of Exercise per Session: Not on file  Stress:   . Feeling of Stress : Not on file  Social Connections:   . Frequency of Communication with Friends and Family: Not on file  . Frequency of Social Gatherings with Friends and Family: Not on file  . Attends Religious Services: Not on file  . Active Member of Clubs or Organizations: Not on file  . Attends Archivist  Meetings: Not on file  . Marital Status: Not on file  Intimate Partner Violence:   . Fear of Current or Ex-Partner: Not on file  . Emotionally Abused: Not on file  . Physically Abused: Not on file  . Sexually Abused: Not on file   Review of Systems  Constitutional: Negative for fatigue and unexpected weight change.       Wears seat belt  HENT: Negative for dental problem, hearing loss and tinnitus.        Overdue for dentist  Eyes: Negative for visual disturbance.       No diplopia or unilateral vision loss  Respiratory: Negative for cough, chest tightness and shortness of breath.   Cardiovascular: Negative for chest pain, palpitations and leg swelling.  Gastrointestinal: Negative for blood in stool.       Occasional heartburn---tums helps  Endocrine: Negative for polydipsia and polyuria.  Genitourinary: Negative for dysuria and hematuria.  Musculoskeletal: Negative for arthralgias, back pain and joint swelling.  Skin:       Skin is drier No suspicious lesions  Allergic/Immunologic: Positive for environmental allergies. Negative for immunocompromised state.       Uses the cetirizine  Neurological: Negative for dizziness, syncope and light-headedness.       Occ caffeine rebound headaches  Hematological: Negative for adenopathy. Bruises/bleeds easily.  Psychiatric/Behavioral: Positive for dysphoric mood. The patient is nervous/anxious.        Objective:   Physical Exam Constitutional:      Appearance: Normal appearance.  HENT:     Right Ear: Tympanic membrane, ear canal and external ear normal.     Left Ear: Tympanic membrane, ear canal and external ear normal.     Mouth/Throat:     Pharynx: No oropharyngeal exudate or posterior oropharyngeal erythema.  Eyes:     Conjunctiva/sclera: Conjunctivae normal.     Pupils: Pupils are equal, round, and reactive to light.  Cardiovascular:     Rate and Rhythm: Normal rate and regular rhythm.     Pulses: Normal pulses.     Heart  sounds: No murmur heard.  No gallop.   Pulmonary:     Effort: Pulmonary effort is normal.     Breath sounds: Normal breath sounds. No wheezing or rales.  Abdominal:     Palpations: Abdomen is soft.     Tenderness: There is no abdominal tenderness.  Musculoskeletal:     Cervical back: Neck supple.     Right lower leg: No edema.     Left lower leg: No edema.  Lymphadenopathy:     Cervical: No cervical adenopathy.  Skin:    General: Skin is warm.  Findings: No rash.  Neurological:     General: No focal deficit present.     Mental Status: She is alert and oriented to person, place, and time.  Psychiatric:        Mood and Affect: Mood normal.        Behavior: Behavior normal.            Assessment & Plan:

## 2020-02-28 LAB — COMPREHENSIVE METABOLIC PANEL
ALT: 10 U/L (ref 0–35)
AST: 16 U/L (ref 0–37)
Albumin: 4.5 g/dL (ref 3.5–5.2)
Alkaline Phosphatase: 85 U/L (ref 39–117)
BUN: 17 mg/dL (ref 6–23)
CO2: 31 mEq/L (ref 19–32)
Calcium: 10.1 mg/dL (ref 8.4–10.5)
Chloride: 100 mEq/L (ref 96–112)
Creatinine, Ser: 1.13 mg/dL (ref 0.40–1.20)
GFR: 50.07 mL/min — ABNORMAL LOW (ref >60.00)
Glucose, Bld: 96 mg/dL (ref 70–99)
Potassium: 3.9 mEq/L (ref 3.5–5.1)
Sodium: 139 mEq/L (ref 135–145)
Total Bilirubin: 0.6 mg/dL (ref 0.2–1.2)
Total Protein: 7.3 g/dL (ref 6.0–8.3)

## 2020-02-28 LAB — CBC
HCT: 41.5 % (ref 36.0–46.0)
Hemoglobin: 14.3 g/dL (ref 12.0–15.0)
MCHC: 34.4 g/dL (ref 30.0–36.0)
MCV: 86.5 fl (ref 78.0–100.0)
Platelets: 249 10*3/uL (ref 150.0–400.0)
RBC: 4.79 Mil/uL (ref 3.87–5.11)
RDW: 12.5 % (ref 11.5–15.5)
WBC: 5.4 10*3/uL (ref 4.0–10.5)

## 2020-02-28 LAB — SEDIMENTATION RATE: Sed Rate: 6 mm/hr (ref 0–30)

## 2020-03-31 ENCOUNTER — Encounter: Payer: Self-pay | Admitting: Internal Medicine

## 2020-03-31 ENCOUNTER — Ambulatory Visit: Payer: BC Managed Care – PPO | Admitting: Internal Medicine

## 2020-03-31 DIAGNOSIS — F330 Major depressive disorder, recurrent, mild: Secondary | ICD-10-CM | POA: Diagnosis not present

## 2020-03-31 MED ORDER — ESCITALOPRAM OXALATE 20 MG PO TABS: 10.0000 mg | ORAL_TABLET | Freq: Every day | ORAL | 0 refills | Status: DC

## 2020-03-31 MED ORDER — BUPROPION HCL ER (SR) 150 MG PO TB12: 150.0000 mg | ORAL_TABLET | Freq: Two times a day (BID) | ORAL | 3 refills | Status: DC

## 2020-03-31 NOTE — Patient Instructions (Signed)
Please go down to just 10mg  of the lexapro (1/2 tab daily). Start the bupropion at 150mg  in the morning for one week and then add a second dose in the early afternoon.

## 2020-03-31 NOTE — Assessment & Plan Note (Signed)
Ongoing symptoms and not better with the increased lexapro Discussed options Will cut back to 10mg  lexapro Add bupropion 150mg  daily and then bid

## 2020-03-31 NOTE — Progress Notes (Signed)
Subjective:    Patient ID: Audrey James, female    DOB: 1951-02-20, 69 y.o.   MRN: [id number]  HPI Here for follow up of depression This visit occurred during the SARS-CoV-2 public health emergency.  Safety protocols were in place, including screening questions prior to the visit, additional usage of staff PPE, and extensive cleaning of exam room while observing appropriate contact time as indicated for disinfecting solutions.   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 cetirizine (ZYRTEC) 10 MG tablet Take 10 mg by mouth daily.    Marland Kitchen escitalopram (LEXAPRO) 20 MG tablet Take 1 tablet (20 mg total) by mouth daily. 90 tablet 3   No current facility-administered medications on file prior to visit.    Allergies  Allergen Reactions  . Cefaclor Hives    Past Medical History:  Diagnosis Date  . Chronic seasonal allergic rhinitis due to pollen    and hives at times  . colon cancer 1984  . History of colon cancer 1984  . Hyperlipidemia   . MDD (major depressive disorder), recurrent episode, mild ([place])   . Obesity (BMI 30.0-34.9)   . Sleep disorder     Past Surgical History:  Procedure Laterality Date  . BREAST EXCISIONAL BIOPSY Right 1999   neg  . BREAST SURGERY  1999   Biopsy--negative  . CESAREAN SECTION    . COLON SURGERY  1984   Partial colectomy    Family History  Problem Relation Age of Onset  . Ovarian cancer Maternal Aunt 54  . Dementia Mother   . Hypertension Mother   . Diabetes Mother   . Heart disease Father   . Cancer  Paternal Grandmother   . Colon cancer Paternal Grandfather     Social History   Socioeconomic History  . Marital status: Divorced    Spouse name: Not on file  . Number of children: 1  . Years of education: Not on file  . Highest education level: Not on file  Occupational History  . Occupation: Art therapist: [employer]  . 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 Smoking status: Never Smoker  . Smokeless tobacco: Never Used  Substance and Sexual Activity  . Alcohol use: Yes    Comment: rare glass of wine  . Drug use: Not on file  . Sexual activity: Not on file  Other Topics Concern  . Not on file  Social History Narrative   Has living will   Daughter is health care POA   Would accept resuscitation   Not sure tube feeds   Social Determinants of Health   Financial Resource Strain:   . Difficulty of Paying Living Expenses: Not on file  Food Insecurity:   . Worried About Charity fundraiser in the Last Year: Not on file  . Ran Out of Food in the Last Year: Not on file  Transportation Needs:   . Lack of Transportation (Medical): Not on file  . Lack of Transportation (Non-Medical): Not on file  Physical Activity:   . Days of Exercise per Week: Not on file  . Minutes of Exercise per Session: Not on file  Stress:   . Feeling of Stress : Not on file  Social Connections:   . Frequency of Communication with Friends and Family: Not on file  . Frequency of Social Gatherings with Friends and Family: Not on file  . Attends Religious Services: Not on file  . Active Member of Clubs or Organizations: Not on file  . Attends Archivist Meetings: Not on file  . Marital Status: Not on file  Intimate Partner Violence:   . Fear of Current or Ex-Partner: Not on file  . Emotionally Abused: Not on file  . Physically Abused: Not on file  . Sexually Abused: Not on file   Review of Systems Appetite is okay Weight seems stable      Objective:   Physical Exam Constitutional:      Appearance: Normal appearance.  Neurological:     Mental Status: She is alert.  Psychiatric:        Mood and Affect: Mood normal.        Behavior: Behavior normal.            Assessment & Plan:

## 2020-04-10 DIAGNOSIS — F418 Other specified anxiety disorders: Secondary | ICD-10-CM | POA: Diagnosis not present

## 2020-04-13 ENCOUNTER — Other Ambulatory Visit: Payer: BC Managed Care – PPO

## 2020-04-13 DIAGNOSIS — Z20822 Contact with and (suspected) exposure to covid-19: Secondary | ICD-10-CM | POA: Diagnosis not present

## 2020-04-15 LAB — SARS-COV-2, NAA 2 DAY TAT

## 2020-04-15 LAB — NOVEL CORONAVIRUS, NAA: SARS-CoV-2, NAA: NOT DETECTED

## 2020-05-04 ENCOUNTER — Other Ambulatory Visit: Payer: Self-pay

## 2020-05-04 ENCOUNTER — Ambulatory Visit: Payer: BC Managed Care – PPO | Admitting: Internal Medicine

## 2020-05-04 ENCOUNTER — Encounter: Payer: Self-pay | Admitting: Internal Medicine

## 2020-05-04 DIAGNOSIS — F330 Major depressive disorder, recurrent, mild: Secondary | ICD-10-CM

## 2020-05-04 NOTE — Progress Notes (Signed)
Subjective:    Patient ID: Audrey James, female    DOB: Aug 04, 1950, 70 y.o.   MRN: [id number]  HPI Here for follow up of depression This visit occurred during the SARS-CoV-2 public health emergency.  Safety protocols were in place, including screening questions prior to the visit, additional usage of staff PPE, and extensive cleaning of exam room while observing appropriate contact time as indicated for disinfecting solutions.   Has done okay with the bupropion Did go up to the twice a day dose---is having a hard time remembering the second dose though  She feels it has made some difference Schedule is different--since she has been on break from work [employer]) More time with daughter and SIL---this is more stressful  Not napping as much  Current Outpatient Medications on File Prior to Visit  Medication Sig Dispense Refill  . buPROPion (WELLBUTRIN SR) 150 MG 12 hr tablet Take 1 tablet (150 mg total) by mouth 2 (two) times daily. 60 tablet 3  . cetirizine (ZYRTEC) 10 MG tablet Take 10 mg by mouth daily.    Marland Kitchen escitalopram (LEXAPRO) 20 MG tablet Take 0.5 tablets (10 mg total) by mouth daily. 1 tablet 0   No current facility-administered medications on file prior to visit.    Allergies  Allergen Reactions  . Cefaclor Hives    Past Medical History:  Diagnosis Date  . Chronic seasonal allergic rhinitis due to pollen    and hives at times  . colon cancer 1984  . History of colon cancer 1984  . Hyperlipidemia   . MDD (major depressive disorder), recurrent episode, mild ([facility])   . Obesity (BMI 30.0-34.9)   . Sleep disorder     Past Surgical History:  Procedure Laterality Date  . BREAST EXCISIONAL BIOPSY Right 1999   neg  . BREAST SURGERY  1999   Biopsy--negative  . CESAREAN SECTION    . COLON SURGERY  1984   Partial colectomy    Family History  Problem Relation Age of Onset  . Ovarian cancer Maternal Aunt 50  . Dementia Mother   . Hypertension Mother   . Diabetes  Mother   . Heart disease Father   . Cancer Paternal Grandmother   . Colon cancer Paternal Grandfather     Social History   Socioeconomic History  . Marital status: Divorced    Spouse name: Not on file  . Number of children: 1  . Years of education: Not on file  . Highest education level: Not on file  Occupational History  . Occupation: Set designer: [employer]  . 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 Smoking status: Never Smoker  . Smokeless tobacco: Never Used  Substance and Sexual Activity  . Alcohol use: Yes    Comment: rare glass of wine  . Drug use: Not on file  . Sexual activity: Not on file  Other Topics Concern  . Not on file  Social History Narrative   Has living will   Daughter is health care POA   Would accept resuscitation   Not sure tube feeds   Social Determinants of Health   Financial Resource Strain: Not on file  Food Insecurity: Not on file  Transportation Needs: Not on file  Physical Activity: Not on file  Stress: Not on file  Social Connections: Not on file  Intimate Partner Violence: Not on file   Review of Systems Sleeping better First class is  just at [facility] for a while--this will help Appetite is good--trying to be careful    Objective:   Physical Exam Constitutional:      Appearance: Normal appearance.  Neurological:     Mental Status: She is alert.  Psychiatric:        Mood and Affect: Mood normal.        Behavior: Behavior normal.            Assessment & Plan:

## 2020-05-04 NOTE — Assessment & Plan Note (Signed)
Doing some better Has chronic depression---discussed that ebbs and flows are common Feels the bupropion has helped Will continue at current dosing

## 2020-05-15 DIAGNOSIS — F418 Other specified anxiety disorders: Secondary | ICD-10-CM | POA: Diagnosis not present

## 2020-06-01 ENCOUNTER — Other Ambulatory Visit: Payer: BC Managed Care – PPO

## 2020-07-02 DIAGNOSIS — F418 Other specified anxiety disorders: Secondary | ICD-10-CM | POA: Diagnosis not present

## 2020-08-21 ENCOUNTER — Emergency Department
Admission: EM | Admit: 2020-08-21 | Discharge: 2020-08-21 | Disposition: A | Discharge status: Home / Self Care | Payer: BC Managed Care – PPO | Attending: Emergency Medicine | Admitting: Emergency Medicine

## 2020-08-21 ENCOUNTER — Emergency Department: Payer: BC Managed Care – PPO

## 2020-08-21 ENCOUNTER — Other Ambulatory Visit: Payer: Self-pay

## 2020-08-21 DIAGNOSIS — Z85038 Personal history of other malignant neoplasm of large intestine: Secondary | ICD-10-CM | POA: Diagnosis not present

## 2020-08-21 DIAGNOSIS — R001 Bradycardia, unspecified: Secondary | ICD-10-CM | POA: Diagnosis not present

## 2020-08-21 DIAGNOSIS — R0602 Shortness of breath: Secondary | ICD-10-CM | POA: Diagnosis not present

## 2020-08-21 DIAGNOSIS — K449 Diaphragmatic hernia without obstruction or gangrene: Secondary | ICD-10-CM | POA: Diagnosis not present

## 2020-08-21 DIAGNOSIS — N132 Hydronephrosis with renal and ureteral calculous obstruction: Secondary | ICD-10-CM | POA: Diagnosis not present

## 2020-08-21 DIAGNOSIS — R42 Dizziness and giddiness: Secondary | ICD-10-CM | POA: Insufficient documentation

## 2020-08-21 DIAGNOSIS — N200 Calculus of kidney: Secondary | ICD-10-CM | POA: Diagnosis not present

## 2020-08-21 DIAGNOSIS — I700 Atherosclerosis of aorta: Secondary | ICD-10-CM | POA: Diagnosis not present

## 2020-08-21 DIAGNOSIS — D7389 Other diseases of spleen: Secondary | ICD-10-CM | POA: Diagnosis not present

## 2020-08-21 DIAGNOSIS — R109 Unspecified abdominal pain: Secondary | ICD-10-CM | POA: Diagnosis not present

## 2020-08-21 LAB — URINALYSIS, COMPLETE (UACMP) WITH MICROSCOPIC
Bilirubin Urine: NEGATIVE
Glucose, UA: NEGATIVE mg/dL
Ketones, ur: 80 mg/dL — AB
Nitrite: NEGATIVE
Protein, ur: 100 mg/dL — AB
RBC / HPF: >50 RBC/hpf — ABNORMAL HIGH (ref 0–5)
Specific Gravity, Urine: 1.027 (ref 1.005–1.030)
pH: 5 (ref 5.0–8.0)

## 2020-08-21 LAB — BASIC METABOLIC PANEL
Anion gap: 11 (ref 5–15)
BUN: 17 mg/dL (ref 8–23)
CO2: 22 mmol/L (ref 22–32)
Calcium: 9.6 mg/dL (ref 8.9–10.3)
Chloride: 104 mmol/L (ref 98–111)
Creatinine, Ser: 1.17 mg/dL — ABNORMAL HIGH (ref 0.44–1.00)
GFR, Estimated: 51 mL/min — ABNORMAL LOW (ref >60)
Glucose, Bld: 150 mg/dL — ABNORMAL HIGH (ref 70–99)
Potassium: 3.9 mmol/L (ref 3.5–5.1)
Sodium: 137 mmol/L (ref 135–145)

## 2020-08-21 LAB — CBC
HCT: 39.3 % (ref 36.0–46.0)
Hemoglobin: 13.8 g/dL (ref 12.0–15.0)
MCH: 30.4 pg (ref 26.0–34.0)
MCHC: 35.1 g/dL (ref 30.0–36.0)
MCV: 86.6 fL (ref 80.0–100.0)
Platelets: 235 10*3/uL (ref 150–400)
RBC: 4.54 MIL/uL (ref 3.87–5.11)
RDW: 12.5 % (ref 11.5–15.5)
WBC: 7.2 10*3/uL (ref 4.0–10.5)
nRBC: 0 % (ref 0.0–0.2)

## 2020-08-21 MED ORDER — ONDANSETRON 4 MG PO TBDP: 4.0000 mg | ORAL_TABLET | Freq: Three times a day (TID) | ORAL | 0 refills | Status: DC | PRN

## 2020-08-21 MED ORDER — KETOROLAC TROMETHAMINE 30 MG/ML IJ SOLN
30.0000 mg | Freq: Once | INTRAMUSCULAR | Status: AC
  Administered 2020-08-21: 30 mg via INTRAVENOUS
  Filled 2020-08-21: qty 1

## 2020-08-21 MED ORDER — HYDROCODONE-ACETAMINOPHEN 5-325 MG PO TABS: 1.0000 | ORAL_TABLET | ORAL | 0 refills | Status: DC | PRN

## 2020-08-21 MED ORDER — TAMSULOSIN HCL 0.4 MG PO CAPS: 0.4000 mg | ORAL_CAPSULE | Freq: Every day | ORAL | 0 refills | Status: DC

## 2020-08-21 MED ORDER — NAPROXEN 500 MG PO TABS
500.0000 mg | ORAL_TABLET | Freq: Two times a day (BID) | ORAL | 2 refills | Status: DC
Start: 2020-08-21 — End: 2020-11-03

## 2020-08-21 MED ORDER — ONDANSETRON HCL 4 MG/2ML IJ SOLN
4.0000 mg | Freq: Once | INTRAMUSCULAR | Status: AC
  Administered 2020-08-21: 4 mg via INTRAVENOUS
  Filled 2020-08-21: qty 2

## 2020-08-21 NOTE — ED Notes (Signed)
Pt given ua cup and urine filter and instructed on use for stone collection, pt verbalizes understanding.

## 2020-08-21 NOTE — ED Provider Notes (Signed)
[facility] Emergency Department Provider Note   ____________________________________________    I have reviewed the triage vital signs and the nursing notes.   HISTORY  Chief Complaint Left flank pain    HPI Audrey James is a 70 y.o. female who presents with complaints of left flank pain.  Patient reports she felt slightly dizzy today, subsequently developed left flank pain as well as nausea and vomiting multiple times.  She did have hematuria as well.  She reports she had a kidney stone many years ago but it was not nearly this painful.  No fevers chills or dysuria.  Has not taken anything for this  Past Medical History:  Diagnosis Date  . Chronic seasonal allergic rhinitis due to pollen    and hives at times  . colon cancer 1984  . History of colon cancer 1984  . Hyperlipidemia   . MDD (major depressive disorder), recurrent episode, mild ([place])   . Obesity (BMI 30.0-34.9)   . Sleep disorder     Patient Active Problem List   Diagnosis Date Noted  . Preventative health care 02/27/2020  . Obesity (BMI 30.0-34.9)   . MDD (major depressive disorder), recurrent episode, mild ([place])   . Sleep disorder   . Hyperlipidemia   . Chronic seasonal allergic rhinitis due to pollen   . Osteopenia of multiple sites 03/02/2017  . Mild episode of recurrent major depressive disorder ([place]) 05/08/2014  . History of colon cancer 1984    Past Surgical History:  Procedure Laterality Date  . BREAST EXCISIONAL BIOPSY Right 1999   neg  . BREAST SURGERY  1999   Biopsy--negative  . CESAREAN SECTION    . COLON SURGERY  1984   Partial colectomy    Prior to Admission medications   Medication Sig Start Date End Date Taking? Authorizing Provider  HYDROcodone-acetaminophen (NORCO/VICODIN) 5-325 MG tablet Take 1 tablet by mouth every 4 (four) hours as needed for moderate pain. 08/21/20  Yes Lavonia Drafts, MD  naproxen (NAPROSYN) 500 MG tablet Take 1 tablet (500 mg  total) by mouth 2 (two) times daily with a meal. 08/21/20  Yes Lavonia Drafts, MD  ondansetron (ZOFRAN ODT) 4 MG disintegrating tablet Take 1 tablet (4 mg total) by mouth every 8 (eight) hours as needed for nausea or vomiting. 08/21/20  Yes Lavonia Drafts, MD  tamsulosin (FLOMAX) 0.4 MG CAPS capsule Take 1 capsule (0.4 mg total) by mouth daily. 08/21/20  Yes Lavonia Drafts, MD  buPROPion [facility] SR) 150 MG 12 hr tablet Take 1 tablet (150 mg total) by mouth 2 (two) times daily. 03/31/20   Venia Carbon, MD  cetirizine (ZYRTEC) 10 MG tablet Take 10 mg by mouth daily.    [provider]  escitalopram (LEXAPRO) 20 MG tablet Take 0.5 tablets (10 mg total) by mouth daily. 03/31/20   Venia Carbon, MD     Allergies Cefaclor  Family History  Problem Relation Age of Onset  . Ovarian cancer Maternal Aunt 72  . Dementia Mother   . Hypertension Mother   . Diabetes Mother   . Heart disease Father   . Cancer Paternal Grandmother   . Colon cancer Paternal Grandfather     Social History Social History   Tobacco Use  . Smoking status: Never Smoker  . Smokeless tobacco: Never Used  Substance Use Topics  . Alcohol use: Yes    Comment: rare glass of wine    Review of Systems  Constitutional: No fever/chills  Eyes: No visual changes.  ENT: No sore throat. Cardiovascular: Denies chest pain. Respiratory: Denies shortness of breath. Gastrointestinal: As above Genitourinary: As above Musculoskeletal: Negative for back pain. Skin: Negative for rash. Neurological: Negative for headaches or weakness   ____________________________________________   PHYSICAL EXAM:  VITAL SIGNS: ED Triage Vitals  Enc Vitals Group     BP 08/21/20 1532 (!) 182/132     Pulse Rate 08/21/20 1532 60     Resp 08/21/20 1532 20     Temp 08/21/20 1531 97.7 F (36.5 C)     Temp Source 08/21/20 1531 Oral     SpO2 08/21/20 1532 97 %     Weight 08/21/20 1531 86.2 kg (190 lb)     Height 08/21/20  1531 1.702 m (5\' 7" )     Head Circumference --      Peak Flow --      Pain Score 08/21/20 1531 9     Pain Loc --      Pain Edu? --      Excl. in [place]? --     Constitutional: Alert and oriented.  Nose: No congestion/rhinnorhea. Mouth/Throat: Mucous membranes are moist.   Neck:  Painless ROM Cardiovascular: Normal rate, regular rhythm. Kermit Balo peripheral circulation. Respiratory: Normal respiratory effort.  No retractions Gastrointestinal: Soft and nontender. No distention.  No CVA tenderness.  Musculoskeletal:   Warm and well perfused Neurologic:  Normal speech and language. No gross focal neurologic deficits are appreciated.  Skin:  Skin is warm, dry and intact. No rash noted. Psychiatric: Mood and affect are normal. Speech and behavior are normal.  ____________________________________________   LABS (all labs ordered are listed, but only abnormal results are displayed)  Labs Reviewed  URINALYSIS, COMPLETE (UACMP) WITH MICROSCOPIC - Abnormal; Notable for the following components:      Result Value   Color, Urine AMBER (*)    APPearance CLOUDY (*)    Hgb urine dipstick LARGE (*)    Ketones, ur 80 (*)    Protein, ur 100 (*)    Leukocytes,Ua TRACE (*)    RBC / HPF >50 (*)    Bacteria, UA RARE (*)    All other components within normal limits  BASIC METABOLIC PANEL - Abnormal; Notable for the following components:   Glucose, Bld 150 (*)    Creatinine, Ser 1.17 (*)    GFR, Estimated 51 (*)    All other components within normal limits  URINE CULTURE  CBC   ____________________________________________  EKG  ED ECG REPORT I, Lavonia Drafts, the attending physician, personally viewed and interpreted this ECG.  Date: 08/21/2020  Rhythm: normal sinus rhythm QRS Axis: normal Intervals: normal ST/T Wave abnormalities: normal Narrative Interpretation: no evidence of acute ischemia  ____________________________________________  RADIOLOGY  CT renal stone study reviewed  by me ____________________________________________   PROCEDURES  Procedure(s) performed: No  Procedures   Critical Care performed: No ____________________________________________   INITIAL IMPRESSION / ASSESSMENT AND PLAN / ED COURSE  Pertinent labs & imaging results that were available during my care of the patient were reviewed by me and considered in my medical decision making (see chart for details).  Patient presents with left flank pain, nausea vomiting, hematuria, most consistent with ureterolithiasis.,  Will treat with IV Toradol, IV Zofran, obtain labs urinalysis CT renal stone study  Patient had significant relief with IV Toradol and Zofran  CT scan demonstrates left-sided ureterolithiasis  Urinalysis pending  Urinalysis is overall reassuring, no dysuria  Patient remains pain-free,  appropriate for discharge at this time, return precautions discussed including fever chills dysuria     ____________________________________________   FINAL CLINICAL IMPRESSION(S) / ED DIAGNOSES  Final diagnoses:  Kidney stone        Note:  This document was prepared using Dragon voice recognition software and may include unintentional dictation errors.   Lavonia Drafts, MD 08/21/20 [id number]

## 2020-08-21 NOTE — ED Triage Notes (Signed)
Pt to ED POV for left side flank pain, hematuria and shob that started within the last hour. States shob started with pain Denies cp

## 2020-08-24 LAB — URINE CULTURE: Culture: 50000 — AB

## 2020-08-25 ENCOUNTER — Telehealth: Payer: Self-pay

## 2020-08-25 NOTE — Telephone Encounter (Signed)
Pt called back. Pain has subsided. Not urinating blood. Taking tamsulosin and Aleve. They just called and told her she had a e.coli uti and starting her on Augmentin.

## 2020-08-25 NOTE — Consult Note (Signed)
Pt's Ucx growing 50,000 Ecoli. Presented to the ED with flank pain. Discussed case with Dr. Quentin Cornwall. Plan to call in prescription for Augmentin x 10 days to [facility]. Spoke with patient about lab results and plan. Patient agree to pick up medication from pharmacy. Asked if Pt has tolerated PCN in the past, and stated she has tolerated them.   Thanks,   Eleonore Chiquito, PharmD, BCPS

## 2020-08-25 NOTE — Telephone Encounter (Signed)
Left message on Verified VM to see how she was doing after recent ER visit for kidney stones. Asked her to call back with an update or send a MyChart message.

## 2020-09-08 ENCOUNTER — Other Ambulatory Visit: Payer: Self-pay | Admitting: Internal Medicine

## 2020-09-18 DIAGNOSIS — F418 Other specified anxiety disorders: Secondary | ICD-10-CM | POA: Diagnosis not present

## 2020-09-25 ENCOUNTER — Other Ambulatory Visit: Payer: Self-pay

## 2020-09-25 ENCOUNTER — Ambulatory Visit: Payer: Self-pay | Admitting: Nurse Practitioner

## 2020-09-25 ENCOUNTER — Encounter: Payer: Self-pay | Admitting: Nurse Practitioner

## 2020-09-25 VITALS — BP 138/96 | HR 70 | Temp 99.2°F | Resp 16

## 2020-09-25 DIAGNOSIS — J40 Bronchitis, not specified as acute or chronic: Secondary | ICD-10-CM

## 2020-09-25 DIAGNOSIS — Z20822 Contact with and (suspected) exposure to covid-19: Secondary | ICD-10-CM

## 2020-09-25 LAB — POC COVID19 BINAXNOW: SARS Coronavirus 2 Ag: NEGATIVE

## 2020-09-25 MED ORDER — AZITHROMYCIN 250 MG PO TABS: ORAL_TABLET | ORAL | 0 refills | Status: AC

## 2020-09-25 NOTE — Progress Notes (Addendum)
   Subjective:    Patient ID: Audrey James, female    DOB: Dec 13, 1950, 70 y.o.   MRN: [id number]  HPI   70 year old female presenting to [facility] today with complaints of cough and nasal congestion for over one week, cough changed to productive over the past few days. She took a home COVID-19 test on day one that was negative earlier this week.  Denies sick contacts Denies fever  History of vaccination for COVID-19 + booster  Denies a history of COVID  Has been using Dayuil and Nyquil without relief   Denies a history of asthma or other respiratory disease  Typically uses Zyrtec for allergies typically   Past Medical History:  Diagnosis Date  . Chronic seasonal allergic rhinitis due to pollen    and hives at times  . colon cancer 1984  . History of colon cancer 1984  . Hyperlipidemia   . MDD (major depressive disorder), recurrent episode, mild ([facility])   . Obesity (BMI 30.0-34.9)   . Sleep disorder     Today's Vitals   09/25/20 1044  BP: (!) 138/96  Pulse: 70  Resp: 16  Temp: 99.2 F (37.3 C)  TempSrc: Tympanic  SpO2: 98%   There is no height or weight on file to calculate BMI.   Review of Systems  Constitutional: Negative.   HENT: Positive for congestion and postnasal drip.   Respiratory: Positive for cough. Negative for chest tightness and shortness of breath.   Cardiovascular: Negative.   Musculoskeletal: Negative.   Neurological: Negative.        Objective:   Physical Exam Constitutional:      Appearance: Normal appearance.  HENT:     Head: Normocephalic.     Right Ear: Tympanic membrane, ear canal and external ear normal.     Left Ear: Tympanic membrane, ear canal and external ear normal.     Nose: Nose normal.     Mouth/Throat:     Mouth: Mucous membranes are moist.  Eyes:     Pupils: Pupils are equal, round, and reactive to light.  Cardiovascular:     Rate and Rhythm: Normal rate and regular rhythm.     Heart sounds: Normal heart sounds.   Pulmonary:     Effort: Pulmonary effort is normal.     Breath sounds: Normal breath sounds.  Musculoskeletal:     Cervical back: Normal range of motion.  Skin:    General: Skin is warm.  Neurological:     General: No focal deficit present.     Mental Status: She is alert.    Rapid COVID-19 Antigen in clinic today Negative       Assessment & Plan:  Advised patient to switch to Mucinex OTC for the next 48 hours  Continue Zyrtec and consider adding Flonase for relief of allergies as well  If no improvement with OTC or with worsening productive cough start antibiotic as discussed. Will prescribe a watch and wait for patient   Return to clinic if symptoms persist or with new concerns.   Meds ordered this encounter  Medications  . azithromycin (ZITHROMAX) 250 MG tablet    Sig: Take 2 tablets on day 1, then 1 tablet daily on days 2 through 5    Dispense:  6 tablet    Refill:  0

## 2020-09-25 NOTE — Addendum Note (Signed)
Addended by: Sharl Ma on: 09/25/2020 12:19 PM   Modules accepted: Orders

## 2020-09-29 DIAGNOSIS — M2011 Hallux valgus (acquired), right foot: Secondary | ICD-10-CM | POA: Diagnosis not present

## 2020-10-22 DIAGNOSIS — N201 Calculus of ureter: Secondary | ICD-10-CM | POA: Diagnosis not present

## 2020-10-22 DIAGNOSIS — N132 Hydronephrosis with renal and ureteral calculous obstruction: Secondary | ICD-10-CM | POA: Diagnosis not present

## 2020-11-03 ENCOUNTER — Encounter: Payer: Self-pay | Admitting: Internal Medicine

## 2020-11-03 ENCOUNTER — Ambulatory Visit: Payer: BC Managed Care – PPO | Admitting: Internal Medicine

## 2020-11-03 ENCOUNTER — Other Ambulatory Visit: Payer: Self-pay

## 2020-11-03 DIAGNOSIS — F330 Major depressive disorder, recurrent, mild: Secondary | ICD-10-CM

## 2020-11-03 NOTE — Assessment & Plan Note (Signed)
Doing fairly well On citalopram and bupropion and doing okay Continues with the counselor

## 2020-11-03 NOTE — Progress Notes (Signed)
Subjective:    Patient ID: Audrey James, female    DOB: Jan 18, 1951, 70 y.o.   MRN: [id number]  HPI Here for follow up of depression This visit occurred during the SARS-CoV-2 public health emergency.  Safety protocols were in place, including screening questions prior to the visit, additional usage of staff PPE, and extensive cleaning of exam room while observing appropriate contact time as indicated for disinfecting solutions.   Is off for the summer Has been able to relax---"I don't feel as burdened" Still worries about retirement Still keeps her granddaughter frequently --- 59 month old grandson hasn't stayed over so much  No problems with the medications Continues to see therapist Exercising more--walks outside mostly (the heat is tough)  Current Outpatient Medications on File Prior to Visit  Medication Sig Dispense Refill   buPROPion (WELLBUTRIN SR) 150 MG 12 hr tablet TAKE ONE TABLET BY MOUTH TWICE DAILY 60 tablet 3   cetirizine (ZYRTEC) 10 MG tablet Take 10 mg by mouth daily.     escitalopram (LEXAPRO) 20 MG tablet Take 0.5 tablets (10 mg total) by mouth daily. 1 tablet 0   No current facility-administered medications on file prior to visit.    Allergies  Allergen Reactions   Cefaclor Hives    Past Medical History:  Diagnosis Date   Chronic seasonal allergic rhinitis due to pollen    and hives at times   colon cancer 1984   History of colon cancer 1984   Hyperlipidemia    MDD (major depressive disorder), recurrent episode, mild ([facility])    Obesity (BMI 30.0-34.9)    Sleep disorder     Past Surgical History:  Procedure Laterality Date   BREAST EXCISIONAL BIOPSY Right 1999   neg   BREAST SURGERY  1999   Biopsy--negative   CESAREAN SECTION     COLON SURGERY  1984   Partial colectomy    Family History  Problem Relation Age of Onset   Ovarian cancer Maternal Aunt 74   Dementia Mother    Hypertension Mother    Diabetes Mother    Heart disease Father     Cancer Paternal Grandmother    Colon cancer Paternal Grandfather     Social History   Socioeconomic History   Marital status: Divorced    Spouse name: Not on file   Number of children: 1   Years of education: Not on file   Highest education level: Not on file  Occupational History   Occupation: Art therapist: [employer]   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  Tobacco Use   Smoking status: Never   Smokeless tobacco: Never  Substance and Sexual Activity   Alcohol use: Yes    Comment: rare glass of wine   Drug use: Not on file   Sexual activity: Not on file  Other Topics Concern   Not on file  Social History Narrative   Has living will   Daughter is health care POA   Would accept resuscitation   Not sure tube feeds   Social Determinants of Health   Financial Resource Strain: Not on file  Food Insecurity: Not on file  Transportation Needs: Not on file  Physical Activity: Not on file  Stress: Not on file  Social Connections: Not on file  Intimate Partner Violence: Not on file   Review of Systems Sleep is variable---doesn't sleep well when granddaughter is there Some reactions to recent 2nd COVID booster Appetite is good ---  weight stable    Objective:   Physical Exam Constitutional:      Appearance: Normal appearance.  Neurological:     Mental Status: She is alert.  Psychiatric:        Mood and Affect: Mood normal.        Behavior: Behavior normal.           Assessment & Plan:

## 2020-11-24 DIAGNOSIS — U071 COVID-19: Secondary | ICD-10-CM | POA: Diagnosis not present

## 2020-12-29 DIAGNOSIS — H04123 Dry eye syndrome of bilateral lacrimal glands: Secondary | ICD-10-CM | POA: Diagnosis not present

## 2020-12-29 DIAGNOSIS — H25813 Combined forms of age-related cataract, bilateral: Secondary | ICD-10-CM | POA: Diagnosis not present

## 2021-01-27 ENCOUNTER — Ambulatory Visit: Payer: Medicare Other

## 2021-02-02 DIAGNOSIS — F418 Other specified anxiety disorders: Secondary | ICD-10-CM | POA: Diagnosis not present

## 2021-02-24 DIAGNOSIS — H2511 Age-related nuclear cataract, right eye: Secondary | ICD-10-CM | POA: Diagnosis not present

## 2021-03-02 HISTORY — PX: CATARACT EXTRACTION W/ INTRAOCULAR LENS IMPLANT: SHX1309

## 2021-03-10 DIAGNOSIS — H2512 Age-related nuclear cataract, left eye: Secondary | ICD-10-CM | POA: Diagnosis not present

## 2021-03-11 ENCOUNTER — Encounter: Payer: Self-pay | Admitting: Internal Medicine

## 2021-03-19 ENCOUNTER — Other Ambulatory Visit: Payer: Self-pay | Admitting: Internal Medicine

## 2021-04-08 DIAGNOSIS — Z9842 Cataract extraction status, left eye: Secondary | ICD-10-CM | POA: Diagnosis not present

## 2021-04-08 DIAGNOSIS — Z961 Presence of intraocular lens: Secondary | ICD-10-CM | POA: Diagnosis not present

## 2021-04-19 DIAGNOSIS — F418 Other specified anxiety disorders: Secondary | ICD-10-CM | POA: Diagnosis not present

## 2021-05-03 ENCOUNTER — Encounter: Payer: Self-pay | Admitting: Internal Medicine

## 2021-05-04 MED ORDER — SULFAMETHOXAZOLE-TRIMETHOPRIM 800-160 MG PO TABS: 1.0000 | ORAL_TABLET | Freq: Two times a day (BID) | ORAL | 0 refills | Status: DC

## 2021-05-13 ENCOUNTER — Other Ambulatory Visit: Payer: Self-pay

## 2021-05-13 ENCOUNTER — Ambulatory Visit (INDEPENDENT_AMBULATORY_CARE_PROVIDER_SITE_OTHER): Payer: BC Managed Care – PPO | Admitting: Internal Medicine

## 2021-05-13 ENCOUNTER — Encounter: Payer: Self-pay | Admitting: Internal Medicine

## 2021-05-13 VITALS — BP 128/80 | HR 80 | Temp 97.7°F | Ht 67.5 in | Wt 192.0 lb

## 2021-05-13 DIAGNOSIS — Z87442 Personal history of urinary calculi: Secondary | ICD-10-CM | POA: Diagnosis not present

## 2021-05-13 DIAGNOSIS — Z23 Encounter for immunization: Secondary | ICD-10-CM

## 2021-05-13 DIAGNOSIS — F330 Major depressive disorder, recurrent, mild: Secondary | ICD-10-CM | POA: Diagnosis not present

## 2021-05-13 DIAGNOSIS — Z0000 Encounter for general adult medical examination without abnormal findings: Secondary | ICD-10-CM | POA: Diagnosis not present

## 2021-05-13 NOTE — Progress Notes (Signed)
Subjective:    Patient ID: Audrey James, female    DOB: Feb 27, 1951, 71 y.o.   MRN: [id number]  HPI Here for physical  Blood in urine has resolved After a few doses of the antibiotic (though no pain) Had stone with CT scan last April  Depression is okay Has a hard time distinguishing between tired/fatigue due to age, etc or depression Walks regularly Does have the academic schedule---gets time off but no recent vacation  Zyrtec for allergies --mostly seasonal, prn otherwise  Current Outpatient Medications on File Prior to Visit  Medication Sig Dispense Refill   buPROPion (WELLBUTRIN SR) 150 MG 12 hr tablet TAKE ONE TABLET BY MOUTH TWICE DAILY 60 tablet 3   cetirizine (ZYRTEC) 10 MG tablet Take 10 mg by mouth daily.     escitalopram (LEXAPRO) 20 MG tablet TAKE 1 TABLET BY MOUTH DAILY 90 tablet 0   No current facility-administered medications on file prior to visit.    Allergies  Allergen Reactions   Cefaclor Hives    Past Medical History:  Diagnosis Date   Chronic seasonal allergic rhinitis due to pollen    and hives at times   colon cancer 1984   History of colon cancer 1984   Hyperlipidemia    MDD (major depressive disorder), recurrent episode, mild ([facility])    Obesity (BMI 30.0-34.9)    Sleep disorder     Past Surgical History:  Procedure Laterality Date   BREAST EXCISIONAL BIOPSY Right 1999   neg   BREAST SURGERY  1999   Biopsy--negative   CATARACT EXTRACTION W/ INTRAOCULAR LENS IMPLANT Right 03/2021   CESAREAN SECTION     COLON SURGERY  1984   Partial colectomy    Family History  Problem Relation Age of Onset   Ovarian cancer Maternal Aunt 49   Dementia Mother    Hypertension Mother    Diabetes Mother    Heart disease Father    Cancer Paternal Grandmother    Colon cancer Paternal Grandfather     Social History   Socioeconomic History   Marital status: Divorced    Spouse name: Not on file   Number of children: 1   Years of education: Not on  file   Highest education level: Not on file  Occupational History   Occupation: Art therapist: [employer]   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  Tobacco Use   Smoking status: Never   Smokeless tobacco: Never  Substance and Sexual Activity   Alcohol use: Yes    Comment: rare glass of wine   Drug use: Not on file   Sexual activity: Not on file  Other Topics Concern   Not on file  Social History Narrative   Has living will   Daughter is health care POA   Would accept resuscitation   Not sure tube feeds   Social Determinants of Health   Financial Resource Strain: Not on file  Food Insecurity: Not on file  Transportation Needs: Not on file  Physical Activity: Not on file  Stress: Not on file  Social Connections: Not on file  Intimate Partner Violence: Not on file   Review of Systems  Constitutional:  Positive for fatigue.       Weight stable Wears seat belt  HENT:  Negative for dental problem, hearing loss and tinnitus.        Overdue for dentist  Eyes:  Negative for visual disturbance.  Vision better after cataract surgery  Respiratory:  Negative for cough, chest tightness and shortness of breath.   Cardiovascular:  Negative for chest pain, palpitations and leg swelling.  Gastrointestinal:  Negative for blood in stool and constipation.       Occ heartburn---related to what she eats. ROlaids  Endocrine: Negative for polydipsia and polyuria.  Genitourinary:  Negative for dysuria and hematuria.  Musculoskeletal:  Positive for arthralgias. Negative for back pain and joint swelling.       Scattered joint pains--no meds  Skin:  Negative for rash.  Allergic/Immunologic: Positive for environmental allergies. Negative for immunocompromised state.  Neurological:  Negative for dizziness, syncope and light-headedness.       Some headaches with weather changes  Hematological:  Negative for adenopathy. Does not bruise/bleed easily.   Psychiatric/Behavioral:         Still gets middle of night awakening --trouble getting back to sleep (1 hour). Will read Usually not tired Mild anxiety at times      Objective:   Physical Exam Constitutional:      Appearance: Normal appearance.  HENT:     Mouth/Throat:     Pharynx: No oropharyngeal exudate or posterior oropharyngeal erythema.  Eyes:     Conjunctiva/sclera: Conjunctivae normal.     Pupils: Pupils are equal, round, and reactive to light.  Cardiovascular:     Rate and Rhythm: Normal rate and regular rhythm.     Pulses: Normal pulses.     Heart sounds: No murmur heard.   No gallop.  Pulmonary:     Effort: Pulmonary effort is normal.     Breath sounds: Normal breath sounds. No wheezing or rales.  Abdominal:     Palpations: Abdomen is soft.     Tenderness: There is no abdominal tenderness.  Musculoskeletal:     Cervical back: Neck supple.     Right lower leg: No edema.     Left lower leg: No edema.  Lymphadenopathy:     Cervical: No cervical adenopathy.  Skin:    Findings: No lesion or rash.  Neurological:     General: No focal deficit present.     Mental Status: She is alert and oriented to person, place, and time.  Psychiatric:        Mood and Affect: Mood normal.        Behavior: Behavior normal.           Assessment & Plan:

## 2021-05-13 NOTE — Addendum Note (Signed)
Addended by: Pilar Grammes on: 05/13/2021 04:09 PM   Modules accepted: Orders

## 2021-05-13 NOTE — Patient Instructions (Signed)
Please set up your screening mammogram. 

## 2021-05-13 NOTE — Assessment & Plan Note (Signed)
Doing fairly well with the bupropion 150 bid and lexapro 20mg 

## 2021-05-13 NOTE — Assessment & Plan Note (Addendum)
Healthy Discussed adding resistance work Colon due 2025 Overdue for mammogram--she will set up No pap due to age Had flu and bivalent COVID vaccines Pneumovax 23 today

## 2021-05-13 NOTE — Assessment & Plan Note (Signed)
Last in April Seemed to pass it

## 2021-05-14 ENCOUNTER — Encounter: Payer: Self-pay | Admitting: Internal Medicine

## 2021-05-14 LAB — CBC
HCT: 40.6 % (ref 36.0–46.0)
Hemoglobin: 13.7 g/dL (ref 12.0–15.0)
MCHC: 33.8 g/dL (ref 30.0–36.0)
MCV: 87.1 fl (ref 78.0–100.0)
Platelets: 272 10*3/uL (ref 150.0–400.0)
RBC: 4.66 Mil/uL (ref 3.87–5.11)
RDW: 12.9 % (ref 11.5–15.5)
WBC: 5.4 10*3/uL (ref 4.0–10.5)

## 2021-05-14 LAB — COMPREHENSIVE METABOLIC PANEL
ALT: 13 U/L (ref 0–35)
AST: 19 U/L (ref 0–37)
Albumin: 4.3 g/dL (ref 3.5–5.2)
Alkaline Phosphatase: 96 U/L (ref 39–117)
BUN: 22 mg/dL (ref 6–23)
CO2: 29 mEq/L (ref 19–32)
Calcium: 9.7 mg/dL (ref 8.4–10.5)
Chloride: 102 mEq/L (ref 96–112)
Creatinine, Ser: 1.05 mg/dL (ref 0.40–1.20)
GFR: 54.22 mL/min — ABNORMAL LOW (ref >60.00)
Glucose, Bld: 140 mg/dL — ABNORMAL HIGH (ref 70–99)
Potassium: 3.9 mEq/L (ref 3.5–5.1)
Sodium: 139 mEq/L (ref 135–145)
Total Bilirubin: 0.4 mg/dL (ref 0.2–1.2)
Total Protein: 7 g/dL (ref 6.0–8.3)

## 2021-05-17 NOTE — Progress Notes (Signed)
Yes--I responded to her

## 2021-06-17 DIAGNOSIS — F418 Other specified anxiety disorders: Secondary | ICD-10-CM | POA: Diagnosis not present

## 2021-06-25 ENCOUNTER — Encounter: Payer: Self-pay | Admitting: Internal Medicine

## 2021-06-25 NOTE — Telephone Encounter (Signed)
Left message on verified VM trying to offer appt with Dr Silvio Pate today.

## 2021-07-01 ENCOUNTER — Other Ambulatory Visit: Payer: Self-pay | Admitting: Internal Medicine

## 2021-07-08 DIAGNOSIS — F418 Other specified anxiety disorders: Secondary | ICD-10-CM | POA: Diagnosis not present

## 2021-08-17 ENCOUNTER — Other Ambulatory Visit: Payer: Self-pay | Admitting: Internal Medicine

## 2021-08-18 NOTE — Telephone Encounter (Signed)
Is this okay to refill? 

## 2021-08-19 ENCOUNTER — Ambulatory Visit: Payer: Medicare Other | Admitting: Nurse Practitioner

## 2021-08-19 ENCOUNTER — Encounter: Payer: Self-pay | Admitting: Nurse Practitioner

## 2021-08-19 VITALS — BP 140/90 | HR 102 | Temp 98.9°F | Wt 199.8 lb

## 2021-08-19 DIAGNOSIS — R319 Hematuria, unspecified: Secondary | ICD-10-CM

## 2021-08-19 DIAGNOSIS — R300 Dysuria: Secondary | ICD-10-CM

## 2021-08-19 LAB — POCT URINALYSIS DIPSTICK OB
Bilirubin, UA: NEGATIVE
Glucose, UA: NEGATIVE
Ketones, UA: NEGATIVE
Leukocytes, UA: NEGATIVE
Nitrite, UA: NEGATIVE
POC,PROTEIN,UA: NEGATIVE
Spec Grav, UA: 1.02 (ref 1.010–1.025)
Urobilinogen, UA: NEGATIVE E.U./dL — AB
pH, UA: 7 (ref 5.0–8.0)

## 2021-08-19 NOTE — Progress Notes (Signed)
? ?  Subjective:  ? ? Patient ID: Audrey James, female    DOB: 26-Sep-1950, 71 y.o.   MRN: [id number] ? ?HPI ?71 year old female presenting to [facility] with complaints of recurrent blood in urine without other symptoms. This first occurred one year ago. She did have symptoms of that time of pain and nausea. She was seen in ED at that time and diagnosed with a kidney stone.  ?She had a follow up with Urology after that visit and workup was negative (per patient records are not visible today)  ? ?In the past 6 months she has had recurrent notable frank blood in her urine as least one episode each month. This is not accompanies by pain or other systemic symptoms.  ? ?For one episode she was treated with antibiotics via a virtual visit did not have any testing performed.  ? ?She had Colon CA at age 56, was treated with surgical resection and chemotherapy at that time.  ? ?Today's Vitals  ? 08/19/21 1339  ?BP: 140/90  ?Pulse: (!) 102  ?Temp: 98.9 ?F (37.2 ?C)  ?Weight: 199 lb 12.8 oz (90.6 kg)  ? ?Body mass index is 30.83 kg/m?.  ? ? ?Review of Systems  ?Constitutional: Negative.   ?HENT: Negative.    ?Eyes: Negative.   ?Respiratory: Negative.    ?Cardiovascular: Negative.   ?Gastrointestinal: Negative.   ?Endocrine: Negative.   ?Genitourinary:  Positive for hematuria.  ?Musculoskeletal: Negative.   ?Neurological: Negative.   ?Hematological: Negative.   ? ?   ?Objective:  ? Physical Exam ?HENT:  ?   Head: Normocephalic.  ?Pulmonary:  ?   Effort: Pulmonary effort is normal.  ?Abdominal:  ?   Tenderness: There is no right CVA tenderness or left CVA tenderness.  ?Musculoskeletal:     ?   General: Normal range of motion.  ?   Cervical back: Normal range of motion.  ?Skin: ?   General: Skin is warm.  ?Neurological:  ?   General: No focal deficit present.  ?   Mental Status: She is alert.  ?Psychiatric:     ?   Mood and Affect: Mood normal.  ? ? ? ?Recent Results (from the past 2160 hour(s))  ?POC Urinalysis  Dipstick OB     Status: Abnormal  ? Collection Time: 08/19/21  2:06 PM  ?Result Value Ref Range  ? Color, UA yellow   ? Clarity, UA clear   ? Glucose, UA Negative Negative  ? Bilirubin, UA negative   ? Ketones, UA negative   ? Spec Grav, UA 1.020 1.010 - 1.025  ? Blood, UA large   ? pH, UA 7.0 5.0 - 8.0  ? POC,PROTEIN,UA Negative Negative, Trace, Small (1+), Moderate (2+), Large (3+), 4+  ? Urobilinogen, UA negative (A) 0.2 or 1.0 E.U./dL  ? Nitrite, UA negative   ? Leukocytes, UA Negative Negative  ? Appearance    ? Odor    ?  ? ? ?   ?Assessment & Plan:  ?1. Hematuria, unspecified type ? ?- POC Urinalysis Dipstick OB (++blood) ?- Urine Culture pending will follow up with results  ?- Ambulatory referral to Urology- given history of colon CA, recurrent hematuria without other systemic symptoms will refer back to urology for further workup.  ? ?RTC as needed  ?   ? ?

## 2021-08-21 LAB — URINE CULTURE: Organism ID, Bacteria: NO GROWTH

## 2021-08-24 ENCOUNTER — Encounter: Payer: Self-pay | Admitting: Nurse Practitioner

## 2021-08-24 ENCOUNTER — Encounter: Payer: Self-pay | Admitting: *Deleted

## 2021-08-26 DIAGNOSIS — F418 Other specified anxiety disorders: Secondary | ICD-10-CM | POA: Diagnosis not present

## 2021-08-31 ENCOUNTER — Other Ambulatory Visit
Admission: RE | Admit: 2021-08-31 | Discharge: 2021-08-31 | Disposition: A | Discharge status: Home / Self Care | Payer: BC Managed Care – PPO | Attending: Urology | Admitting: Urology

## 2021-08-31 ENCOUNTER — Other Ambulatory Visit: Payer: Self-pay | Admitting: *Deleted

## 2021-08-31 ENCOUNTER — Ambulatory Visit: Payer: BC Managed Care – PPO | Admitting: Urology

## 2021-08-31 ENCOUNTER — Encounter: Payer: Self-pay | Admitting: Urology

## 2021-08-31 VITALS — BP 133/85 | HR 91 | Ht 67.0 in | Wt 199.0 lb

## 2021-08-31 DIAGNOSIS — R310 Gross hematuria: Secondary | ICD-10-CM

## 2021-08-31 DIAGNOSIS — R319 Hematuria, unspecified: Secondary | ICD-10-CM | POA: Insufficient documentation

## 2021-08-31 LAB — URINALYSIS, COMPLETE (UACMP) WITH MICROSCOPIC
Bilirubin Urine: NEGATIVE
Glucose, UA: NEGATIVE mg/dL
Ketones, ur: NEGATIVE mg/dL
Leukocytes,Ua: NEGATIVE
Nitrite: NEGATIVE
Protein, ur: NEGATIVE mg/dL
Specific Gravity, Urine: 1.025 (ref 1.005–1.030)
WBC, UA: NONE SEEN WBC/hpf (ref 0–5)
pH: 5.5 (ref 5.0–8.0)

## 2021-08-31 NOTE — Patient Instructions (Signed)
Cystoscopy ?Cystoscopy is a procedure that is used to help diagnose and sometimes treat conditions that affect the lower urinary tract. The lower urinary tract includes the bladder and the urethra. The urethra is the tube that drains urine from the bladder. Cystoscopy is done using a thin, tube-shaped instrument with a light and camera at the end (cystoscope). The cystoscope may be hard or flexible, depending on the goal of the procedure. The cystoscope is inserted through the urethra, into the bladder. ?Cystoscopy may be recommended if you have: ?Urinary tract infections that keep coming back. ?Blood in the urine (hematuria). ?An inability to control when you urinate (urinary incontinence) or an overactive bladder. ?Unusual cells found in a urine sample. ?A blockage in the urethra, such as a urinary stone. ?Painful urination. ?An abnormality in the bladder found during an intravenous pyelogram (IVP) or CT scan. ?What are the risks? ?Generally, this is a safe procedure. However, problems may occur, including: ?Infection. ?Bleeding. ? ?What happens during the procedure? ? ?You will be given one or more of the following: ?A medicine to numb the area (local anesthetic). ?The area around the opening of your urethra will be cleaned. ?The cystoscope will be passed through your urethra into your bladder. ?Germ-free (sterile) fluid will flow through the cystoscope to fill your bladder. The fluid will stretch your bladder so that your health care provider can clearly examine your bladder walls. ?Your doctor will look at the urethra and bladder. ?The cystoscope will be removed ?The procedure may vary among health care providers  ?What can I expect after the procedure? ?After the procedure, it is common to have: ?Some soreness or pain in your urethra. ?Urinary symptoms. These include: ?Mild pain or burning when you urinate. Pain should stop within a few minutes after you urinate. This may last for up to a few days after the  procedure. ?A small amount of blood in your urine for several days. ?Feeling like you need to urinate but producing only a small amount of urine. ?Follow these instructions at home: ?General instructions ?Return to your normal activities as told by your health care provider.  ?Drink plenty of fluids after the procedure. ?Keep all follow-up visits as told by your health care provider. This is important. ?Contact a health care provider if you: ?Have pain that gets worse or does not get better with medicine, especially pain when you urinate lasting longer than 72 hours after the procedure. ?Have trouble urinating. ?Get help right away if you: ?Have blood clots in your urine. ?Have a fever or chills. ?Are unable to urinate. ?Summary ?Cystoscopy is a procedure that is used to help diagnose and sometimes treat conditions that affect the lower urinary tract. ?Cystoscopy is done using a thin, tube-shaped instrument with a light and camera at the end. ?After the procedure, it is common to have some soreness or pain in your urethra. ?It is normal to have blood in your urine after the procedure.  ?If you were prescribed an antibiotic medicine, take it as told by your health care provider.  ?This information is not intended to replace advice given to you by your health care provider. Make sure you discuss any questions you have with your health care provider. ?Document Revised: 04/10/2018 Document Reviewed: 04/10/2018 ?Elsevier Patient Education ? [place]. ? ? ?Hematuria, Adult ?Hematuria is blood in the urine. Blood may be visible in the urine, or it may be identified with a test. This condition can be caused by infections of  the bladder, urethra, kidney, or prostate. Other possible causes include: ?Kidney stones. ?Cancer of the urinary tract. ?Too much calcium in the urine. ?Conditions that are passed from parent to child (inherited conditions). ?Exercise that requires a lot of energy. ?Infections can usually be  treated with medicine, and a kidney stone usually will pass through your urine. If neither of these is the cause of your hematuria, more tests may be needed to identify the cause of your symptoms. ?It is very important to tell your health care provider about any blood in your urine, even if it is painless or the blood stops without treatment. Blood in the urine, when it happens and then stops and then happens again, can be a symptom of a very serious condition, including cancer. There is no pain in the initial stages of many urinary cancers. ?Follow these instructions at home: ?Medicines ?Take over-the-counter and prescription medicines only as told by your health care provider. ?If you were prescribed an antibiotic medicine, take it as told by your health care provider. Do not stop taking the antibiotic even if you start to feel better. ?Eating and drinking ?Drink enough fluid to keep your urine pale yellow. It is recommended that you drink 3-4 quarts (2.8-3.8 L) a day. If you have been diagnosed with an infection, drinking cranberry juice in addition to large amounts of water is recommended. ?Avoid caffeine, tea, and carbonated beverages. These tend to irritate the bladder. ?Avoid alcohol because it may irritate the prostate (in males). ?General instructions ?If you have been diagnosed with a kidney stone, follow your health care provider's instructions about straining your urine to catch the stone. ?Empty your bladder often. Avoid holding urine for long periods of time. ?If you are female: ?After a bowel movement, wipe from front to back and use each piece of toilet paper only once. ?Empty your bladder before and after sex. ?Pay attention to any changes in your symptoms. Tell your health care provider about any changes or any new symptoms. ?It is up to you to get the results of any tests. Ask your health care provider, or the department that is doing the test, when your results will be ready. ?Keep all follow-up  visits. This is important. ?Contact a health care provider if: ?You develop back pain. ?You have a fever or chills. ?You have nausea or vomiting. ?Your symptoms do not improve after 3 days. ?Your symptoms get worse. ?Get help right away if: ?You develop severe vomiting and are unable to take medicine without vomiting. ?You develop severe pain in your back or abdomen even though you are taking medicine. ?You pass a large amount of blood in your urine. ?You pass blood clots in your urine. ?You feel very weak or like you might faint. ?You faint. ?Summary ?Hematuria is blood in the urine. It has many possible causes. ?It is very important that you tell your health care provider about any blood in your urine, even if it is painless or the blood stops without treatment. ?Take over-the-counter and prescription medicines only as told by your health care provider. ?Drink enough fluid to keep your urine pale yellow. ?This information is not intended to replace advice given to you by your health care provider. Make sure you discuss any questions you have with your health care provider. ?Document Revised: 12/18/2019 Document Reviewed: 12/18/2019 ?Elsevier Patient Education ? [place]. ? ?

## 2021-08-31 NOTE — Progress Notes (Signed)
? ?  08/31/21 ?10:33 AM  ? ?Stevan Born ?[birth year] ?[id number] ? ?CC: Gross hematuria ? ?HPI: ?71 year old female who reports a few episodes of gross hematuria over the last 6 months.  This was originally treated with antibiotics by PCP.  She has a history of kidney stones, most recently in April 2022 when she passed a 3 mm left ureteral stone.  Her history is also notable for colon cancer at age 58 treated with a partial colectomy and chemotherapy.  She denies any flank pain.  Urine culture 4/20 was negative. ? ?Urinalysis today with 11-20 RBCs, rare bacteria, 0 WBCs, 0 squamous cells, negative leukocytes, nitrite negative. ? ?She works as a Systems analyst at [employer]. ? ? ?PMH: ?Past Medical History:  ?Diagnosis Date  ? Chronic seasonal allergic rhinitis due to pollen   ? and hives at times  ? colon cancer 1984  ? History of colon cancer 1984  ? Hyperlipidemia   ? MDD (major depressive disorder), recurrent episode, mild ([place])   ? Obesity (BMI 30.0-34.9)   ? Sleep disorder   ? ? ?Surgical History: ?Past Surgical History:  ?Procedure Laterality Date  ? BREAST EXCISIONAL BIOPSY Right 1999  ? neg  ? BREAST SURGERY  1999  ? Biopsy--negative  ? CATARACT EXTRACTION W/ INTRAOCULAR LENS IMPLANT Right 03/2021  ? CESAREAN SECTION    ? COLON SURGERY  1984  ? Partial colectomy  ? ? ?Family History: ?Family History  ?Problem Relation Age of Onset  ? Ovarian cancer Maternal Aunt 71  ? Dementia Mother   ? Hypertension Mother   ? Diabetes Mother   ? Heart disease Father   ? Cancer Paternal Grandmother   ? Colon cancer Paternal Grandfather   ? ? ?Social History:  reports that she has never smoked. She has never been exposed to tobacco smoke. She has never used smokeless tobacco. She reports current alcohol use. No history on file for drug use. ? ?Physical Exam: ?BP 133/85   Pulse 91   Ht '5\' 7"'$  (1.702 m)   Wt 199 lb (90.3 kg)   BMI 31.17 kg/m?   ? ?Constitutional:  Alert and oriented, No acute distress. ?Cardiovascular: No  clubbing, cyanosis, or edema. ?Respiratory: Normal respiratory effort, no increased work of breathing. ?GI: Abdomen is soft, nontender, nondistended, no abdominal masses ? ? ?Laboratory Data: ?Reviewed ? ?Pertinent Imaging: ?I have personally viewed and interpreted the CT stone protocol from April 2022 showing a left proximal ureteral stone but no other obvious abnormalities. ? ?Assessment & Plan:   ?71 year old female with a few episodes of painless gross hematuria over the last 6 months, with microscopic hematuria confirmed on UA today with 11-20 RBCs, and recent negative urine culture. ? ?We discussed common possible etiologies of hematuria including malignancy, urolithiasis, medical renal disease, and idiopathic. Standard workup recommended by the AUA includes imaging with CT urogram to assess the upper tracts, and cystoscopy. Cytology is performed on patient's with gross hematuria to look for malignant cells in the urine. ? ?CT and cystoscopy to complete hematuria work-up ? ? ?Nickolas Madrid, MD ?08/31/2021 ? ?[facility] ?[address], [address] ?[place], [place] [address] ?[phone] ? ? ?

## 2021-10-01 ENCOUNTER — Ambulatory Visit
Admission: RE | Admit: 2021-10-01 | Discharge: 2021-10-01 | Disposition: A | Discharge status: Home / Self Care | Payer: BC Managed Care – PPO | Source: Ambulatory Visit | Attending: Urology | Admitting: Urology

## 2021-10-01 DIAGNOSIS — N201 Calculus of ureter: Secondary | ICD-10-CM | POA: Diagnosis not present

## 2021-10-01 DIAGNOSIS — R310 Gross hematuria: Secondary | ICD-10-CM | POA: Insufficient documentation

## 2021-10-01 DIAGNOSIS — N1330 Unspecified hydronephrosis: Secondary | ICD-10-CM | POA: Diagnosis not present

## 2021-10-01 LAB — POCT I-STAT CREATININE: Creatinine, Ser: 1 mg/dL (ref 0.44–1.00)

## 2021-10-01 MED ORDER — IOHEXOL 300 MG/ML  SOLN
125.0000 mL | Freq: Once | INTRAMUSCULAR | Status: AC | PRN
  Administered 2021-10-01: 125 mL via INTRAVENOUS

## 2021-10-04 DIAGNOSIS — Z03818 Encounter for observation for suspected exposure to other biological agents ruled out: Secondary | ICD-10-CM | POA: Diagnosis not present

## 2021-10-04 DIAGNOSIS — J020 Streptococcal pharyngitis: Secondary | ICD-10-CM | POA: Diagnosis not present

## 2021-10-05 ENCOUNTER — Other Ambulatory Visit: Payer: BC Managed Care – PPO | Admitting: Urology

## 2021-10-12 ENCOUNTER — Other Ambulatory Visit
Admission: RE | Admit: 2021-10-12 | Discharge: 2021-10-12 | Disposition: A | Discharge status: Home / Self Care | Payer: BC Managed Care – PPO | Attending: Urology | Admitting: Urology

## 2021-10-12 ENCOUNTER — Other Ambulatory Visit: Payer: Self-pay | Admitting: Urology

## 2021-10-12 ENCOUNTER — Ambulatory Visit: Payer: BC Managed Care – PPO | Admitting: Urology

## 2021-10-12 ENCOUNTER — Encounter: Payer: Self-pay | Admitting: Urology

## 2021-10-12 VITALS — BP 130/86 | HR 90 | Ht 67.0 in | Wt 194.8 lb

## 2021-10-12 DIAGNOSIS — N200 Calculus of kidney: Secondary | ICD-10-CM | POA: Diagnosis not present

## 2021-10-12 DIAGNOSIS — N9489 Other specified conditions associated with female genital organs and menstrual cycle: Secondary | ICD-10-CM

## 2021-10-12 DIAGNOSIS — R310 Gross hematuria: Secondary | ICD-10-CM | POA: Insufficient documentation

## 2021-10-12 DIAGNOSIS — N201 Calculus of ureter: Secondary | ICD-10-CM

## 2021-10-12 LAB — URINALYSIS, COMPLETE (UACMP) WITH MICROSCOPIC
Glucose, UA: NEGATIVE mg/dL
Leukocytes,Ua: NEGATIVE
Nitrite: NEGATIVE
Protein, ur: NEGATIVE mg/dL
Specific Gravity, Urine: 1.025 (ref 1.005–1.030)
pH: 5.5 (ref 5.0–8.0)

## 2021-10-12 NOTE — Patient Instructions (Signed)
Laser Therapy for Kidney Stones Laser therapy for kidney stones is a procedure to break up small, hard mineral deposits that form in the kidney (kidney stones). The procedure is done using a device that produces a focused beam of light (laser). The laser breaks up kidney stones into pieces that are small enough to be passed out of the body through urination or removed from the body during the procedure. You may need laser therapy if you have kidney stones that are painful or block your urinary tract. This procedure is done by inserting a tube (ureteroscope) into your kidney through the urethral opening. The urethra is the part of the body that drains urine from the bladder. In women, the urethra opens above the vaginal opening. In men, the urethra opens at the tip of the penis. The ureteroscope is inserted through the urethra, and surgical instruments are moved through the bladder and the muscular tube that connects the kidney to the bladder (ureter) until they reach the kidney. Tell a health care provider about: Any allergies you have. All medicines you are taking, including vitamins, herbs, eye drops, creams, and over-the-counter medicines. Any problems you or family members have had with anesthetic medicines. Any blood disorders you have. Any surgeries you have had. Any medical conditions you have. Whether you are pregnant or may be pregnant. What are the risks? Generally, this is a safe procedure. However, problems may occur, including: Infection. Bleeding. Allergic reactions to medicines. Damage to the urethra, bladder, or ureter. Urinary tract infection (UTI). Narrowing of the urethra (urethral stricture). Difficulty passing urine. Blockage of the kidney caused by a fragment of kidney stone. What happens before the procedure? Medicines Ask your health care provider about: Changing or stopping your regular medicines. This is especially important if you are taking diabetes medicines or  blood thinners. Taking medicines such as aspirin and ibuprofen. These medicines can thin your blood. Do not take these medicines unless your health care provider tells you to take them. Taking over-the-counter medicines, vitamins, herbs, and supplements. Eating and drinking Follow instructions from your health care provider about eating and drinking, which may include: 8 hours before the procedure - stop eating heavy meals or foods, such as meat, fried foods, or fatty foods. 6 hours before the procedure - stop eating light meals or foods, such as toast or cereal. 6 hours before the procedure - stop drinking milk or drinks that contain milk. 2 hours before the procedure - stop drinking clear liquids. Staying hydrated Follow instructions from your health care provider about hydration, which may include: Up to 2 hours before the procedure - you may continue to drink clear liquids, such as water, clear fruit juice, black coffee, and plain tea.  General instructions You may have a physical exam before the procedure. You may also have tests, such as imaging tests and blood or urine tests. If your ureter is too narrow, your health care provider may place a soft, flexible tube (stent) inside of it. The stent may be placed days or weeks before your laser therapy procedure. Plan to have someone take you home from the hospital or clinic. If you will be going home right after the procedure, plan to have someone stay with you for 24 hours. Do not use any products that contain nicotine or tobacco for at least 4 weeks before the procedure. These products include cigarettes, e-cigarettes, and chewing tobacco. If you need help quitting, ask your health care provider. Ask your health care provider: How your   surgical site will be marked or identified. What steps will be taken to help prevent infection. These may include: Removing hair at the surgery site. Washing skin with a germ-killing soap. Taking antibiotic  medicine. What happens during the procedure?  An IV will be inserted into one of your veins. You will be given one or more of the following: A medicine to help you relax (sedative). A medicine to numb the area (local anesthetic). A medicine to make you fall asleep (general anesthetic). A ureteroscope will be inserted into your urethra. The ureteroscope will send images to a video screen in the operating room to guide your surgeon to the area of your kidney that will be treated. A small, flexible tube will be threaded through the ureteroscope and into your bladder and ureter, up to your kidney. The laser device will be inserted into your kidney through the tube. Your surgeon will pulse the laser on and off to break up kidney stones. A surgical instrument that has a tiny wire basket may be inserted through the tube into your kidney to remove the pieces of broken kidney stone. The procedure may vary among health care providers and hospitals. What happens after the procedure? Your blood pressure, heart rate, breathing rate, and blood oxygen level will be monitored until you leave the hospital or clinic. You will be given pain medicine as needed. You may continue to receive antibiotics. You may have a stent temporarily placed in your ureter. Do not drive for 24 hours if you were given a sedative during your procedure. You may be given a strainer to collect any stone fragments that you pass in your urine. Your health care provider may have these tested. Summary Laser therapy for kidney stones is a procedure to break up kidney stones into pieces that are small enough to be passed out of the body through urination or removed during the procedure. Follow instructions from your health care provider about eating and drinking before the procedure. During the procedure, the ureteroscope will send images to a video screen to guide your surgeon to the area of your kidney that will be treated. Do not drive  for 24 hours if you were given a sedative during your procedure. This information is not intended to replace advice given to you by your health care provider. Make sure you discuss any questions you have with your health care provider. Document Revised: 12/21/2020 Document Reviewed: 12/21/2020 Elsevier Patient Education  2023 Elsevier Inc.  

## 2021-10-12 NOTE — Progress Notes (Unsigned)
Surgical Physician Order Form [facility] [place]  * Scheduling expectation : Next Available (6/23)  *Length of Case: 1 hour  *Clearance needed: no  *Anticoagulation Instructions: Hold all anticoagulants  *Aspirin Instructions: Hold Aspirin  *Post-op visit Date/Instructions: TBD  *Diagnosis: Left Ureteral Stone  *Procedure: left Ureteroscopy w/laser lithotripsy & stent placement (60677)   Additional orders: N/A  -Admit type: OUTpatient  -Anesthesia: General  -VTE Prophylaxis Standing Order SCD's       Other:   -Standing Lab Orders Per Anesthesia    Lab other: UA&Urine Culture  -Standing Test orders EKG/Chest x-ray per Anesthesia       Test other:   - Medications:  Cipro '400mg'$  IV  -Other orders:  N/A

## 2021-10-12 NOTE — Progress Notes (Signed)
   16/01/9603 54:09 PM   Audrey James [birth year] [id number]  Reason for visit: Follow up gross hematuria, CT findings  HPI: 71 year old female who presented with gross hematuria, and CT urogram showed a 1 cm left UPJ stone with mild hydronephrosis.  Interestingly there was a small calcification noted in the similar location in April 2022.  She continues to deny any pain, but has some ongoing hematuria.  I personally viewed and interpreted the images and agree with this finding of a 1 cm left UPJ stone, and there are no filling defects.  There is also a 1 cm soft tissue density in the anterior uterine corpus, and follow-up transvaginal pelvic ultrasound was recommended.  We reviewed options for her gross hematuria in the setting of a 1 cm left UPJ stone including cystoscopy in clinic today with follow-up shockwave lithotripsy for her left proximal ureteral stone, or considering left ureteroscopy, laser lithotripsy, stent placement in the OR, with possible biopsy of any bladder lesions found at that time. We specifically discussed the risks ureteroscopy including bleeding, infection/sepsis, stent related symptoms including flank pain/urgency/frequency/incontinence/dysuria, ureteral injury, inability to access stone, or need for staged or additional procedures.  -Using shared decision making she opted for left ureteroscopy, laser lithotripsy, stent placement, will perform cystoscopy at that time for gross hematuria and biopsy any abnormal bladder lesions though low suspicion based on normal CT -Transvaginal pelvic ultrasound ordered for further evaluation of 1 cm soft tissue density within the uterine corpus  [place], [place] [address], [place], [place] [address] [phone]

## 2021-10-13 ENCOUNTER — Telehealth: Payer: Self-pay

## 2021-10-13 LAB — URINE CULTURE: Culture: NO GROWTH

## 2021-10-13 NOTE — Telephone Encounter (Signed)
I spoke with Audrey James. We have discussed possible surgery dates and Friday June 23rd, 2023 was agreed upon by all parties. Patient given information about surgery date, what to expect pre-operatively and post operatively.  We discussed that a Pre-Admission Testing office will be calling to set up the pre-op visit that will take place prior to surgery, and that these appointments are typically done over the phone with a Pre-Admissions RN.  Informed patient that our office will communicate any additional care to be provided after surgery. Patients questions or concerns were discussed during our call. Advised to call our office should there be any additional information, questions or concerns that arise. Patient verbalized understanding.

## 2021-10-13 NOTE — Progress Notes (Signed)
[facility] Posting Form   Surgery Date/Time: Date: 10/22/2021  Surgeon: Dr. Nickolas Madrid, MD  Surgery Location: Day Surgery  Inpt ( No  )   Outpt (Yes)   Obs ( No  )   Diagnosis: N20.1 Left Ureteral Stone  -CPT: [phone]  Surgery: Left Ureteroscopy with Laser Lithotripsy and Stent Placement  Stop Anticoagulations: Yes and hold ASA   Cardiac/Medical/Pulmonary Clearance needed: no  *Orders entered into EPIC  Date: 10/13/21   *Case booked in EPIC  Date: 10/13/21  *Notified pt of Surgery: Date: 10/12/2021  PRE-OP UA & CX: yes, obtained on 10/12/2021  *Placed into Prior Authorization Work Fabio Bering Date: 10/13/21   Assistant/laser/rep:No

## 2021-10-15 ENCOUNTER — Other Ambulatory Visit: Payer: Self-pay

## 2021-10-15 ENCOUNTER — Encounter
Admission: RE | Admit: 2021-10-15 | Discharge: 2021-10-15 | Disposition: A | Discharge status: Home / Self Care | Payer: BC Managed Care – PPO | Source: Ambulatory Visit | Attending: Urology | Admitting: Urology

## 2021-10-15 DIAGNOSIS — Z01812 Encounter for preprocedural laboratory examination: Secondary | ICD-10-CM

## 2021-10-15 HISTORY — DX: Headache, unspecified: R51.9

## 2021-10-15 HISTORY — DX: Personal history of urinary calculi: Z87.442

## 2021-10-15 NOTE — Patient Instructions (Signed)
Your procedure is scheduled on: 10/22/21 - Friday Report to the Registration Desk on the 1st floor of the [facility]. To find out your arrival time, please call [phone] between 1PM - 3PM on: 10/21/21 - Thursday If your arrival time is 6:00 am, do not arrive prior to that time as the [facility] entrance doors do not open until 6:00 am.  REMEMBER: Instructions that are not followed completely may result in serious medical risk, up to and including death; or upon the discretion of your surgeon and anesthesiologist your surgery may need to be rescheduled.  Do not eat food or drink any fluids after midnight the night before surgery.  No gum chewing, lozengers or hard candies.  TAKE THESE MEDICATIONS THE MORNING OF SURGERY WITH A SIP OF WATER:  - buPROPion (WELLBUTRIN SR) 150 MG 12 hr tablet - escitalopram (LEXAPRO) 20 MG tablet  One week prior to surgery: Stop Anti-inflammatories (NSAIDS) such as Advil, Aleve, Ibuprofen, Motrin, Naproxen, Naprosyn and Aspirin based products such as Excedrin, Goodys Powder, BC Powder.  Stop ANY OVER THE COUNTER supplements until after surgery.  You may however, continue to take Tylenol if needed for pain up until the day of surgery.  No Alcohol for 24 hours before or after surgery.  No Smoking including e-cigarettes for 24 hours prior to surgery.  No chewable tobacco products for at least 6 hours prior to surgery.  No nicotine patches on the day of surgery.  Do not use any "recreational" drugs for at least a week prior to your surgery.  Please be advised that the combination of cocaine and anesthesia may have negative outcomes, up to and including death. If you test positive for cocaine, your surgery will be cancelled.  On the morning of surgery brush your teeth with toothpaste and water, you may rinse your mouth with mouthwash if you wish. Do not swallow any toothpaste or mouthwash.  Do not wear jewelry, make-up, hairpins, clips or nail  polish.  Do not wear lotions, powders, or perfumes.   Do not shave body from the neck down 48 hours prior to surgery just in case you cut yourself which could leave a site for infection.  Also, freshly shaved skin may become irritated if using the CHG soap.  Contact lenses, hearing aids and dentures may not be worn into surgery.  Do not bring valuables to the hospital. [facility] is not responsible for any missing/lost belongings or valuables.   Notify your doctor if there is any change in your medical condition (cold, fever, infection).  Wear comfortable clothing (specific to your surgery type) to the hospital.  After surgery, you can help prevent lung complications by doing breathing exercises.  Take deep breaths and cough every 1-2 hours. Your doctor may order a device called an Incentive Spirometer to help you take deep breaths. When coughing or sneezing, hold a pillow firmly against your incision with both hands. This is called "splinting." Doing this helps protect your incision. It also decreases belly discomfort.  If you are being admitted to the hospital overnight, leave your suitcase in the car. After surgery it may be brought to your room.  If you are being discharged the day of surgery, you will not be allowed to drive home. You will need a responsible adult (18 years or older) to drive you home and stay with you that night.   If you are taking public transportation, you will need to have a responsible adult (18 years or older)  with you. Please confirm with your physician that it is acceptable to use public transportation.   Please call the [facility] Dept. at [phone] if you have any questions about these instructions.  Surgery Visitation Policy:  Patients undergoing a surgery or procedure may have two family members or support persons with them as long as the person is not COVID-19 positive or experiencing its symptoms.   Inpatient Visitation:     Visiting hours are 7 a.m. to 8 p.m. Up to four visitors are allowed at one time in a patient room, including children. The visitors may rotate out with other people during the day. One designated support person (adult) may remain overnight.

## 2021-10-18 ENCOUNTER — Encounter: Payer: Self-pay | Admitting: Urgent Care

## 2021-10-18 ENCOUNTER — Encounter
Admission: RE | Admit: 2021-10-18 | Discharge: 2021-10-18 | Disposition: A | Discharge status: Home / Self Care | Payer: BC Managed Care – PPO | Source: Ambulatory Visit | Attending: Urology | Admitting: Urology

## 2021-10-18 ENCOUNTER — Encounter: Payer: Self-pay | Admitting: Nurse Practitioner

## 2021-10-18 DIAGNOSIS — Z01810 Encounter for preprocedural cardiovascular examination: Secondary | ICD-10-CM | POA: Diagnosis not present

## 2021-10-18 DIAGNOSIS — Z01812 Encounter for preprocedural laboratory examination: Secondary | ICD-10-CM

## 2021-10-21 MED ORDER — LACTATED RINGERS IV SOLN: INTRAVENOUS | Status: DC

## 2021-10-21 MED ORDER — CHLORHEXIDINE GLUCONATE 0.12 % MT SOLN: 15.0000 mL | Freq: Once | OROMUCOSAL | Status: AC

## 2021-10-21 MED ORDER — CIPROFLOXACIN IN D5W 400 MG/200ML IV SOLN
400.0000 mg | INTRAVENOUS | Status: AC
  Administered 2021-10-22: 400 mg via INTRAVENOUS

## 2021-10-21 MED ORDER — ORAL CARE MOUTH RINSE: 15.0000 mL | Freq: Once | OROMUCOSAL | Status: AC

## 2021-10-21 MED ORDER — FAMOTIDINE 20 MG PO TABS: 20.0000 mg | ORAL_TABLET | Freq: Once | ORAL | Status: AC

## 2021-10-22 ENCOUNTER — Ambulatory Visit: Payer: BC Managed Care – PPO

## 2021-10-22 ENCOUNTER — Ambulatory Visit: Payer: BC Managed Care – PPO | Admitting: Anesthesiology

## 2021-10-22 ENCOUNTER — Encounter: Payer: Self-pay | Admitting: Urology

## 2021-10-22 ENCOUNTER — Ambulatory Visit
Admission: RE | Admit: 2021-10-22 | Discharge: 2021-10-22 | Disposition: A | Discharge status: Home / Self Care | Payer: BC Managed Care – PPO | Attending: Urology | Admitting: Urology

## 2021-10-22 ENCOUNTER — Encounter
Admission: RE | Disposition: A | Discharge status: Home / Self Care | Payer: Self-pay | Source: Home / Self Care | Attending: Urology

## 2021-10-22 DIAGNOSIS — F32A Depression, unspecified: Secondary | ICD-10-CM | POA: Diagnosis not present

## 2021-10-22 DIAGNOSIS — N201 Calculus of ureter: Secondary | ICD-10-CM | POA: Insufficient documentation

## 2021-10-22 DIAGNOSIS — Z6830 Body mass index (BMI) 30.0-30.9, adult: Secondary | ICD-10-CM | POA: Diagnosis not present

## 2021-10-22 DIAGNOSIS — E669 Obesity, unspecified: Secondary | ICD-10-CM | POA: Diagnosis not present

## 2021-10-22 HISTORY — PX: CYSTOSCOPY/URETEROSCOPY/HOLMIUM LASER/STENT PLACEMENT: SHX6546

## 2021-10-22 SURGERY — CYSTOSCOPY/URETEROSCOPY/HOLMIUM LASER/STENT PLACEMENT
Anesthesia: General | Laterality: Left

## 2021-10-22 MED ORDER — FENTANYL CITRATE (PF) 100 MCG/2ML IJ SOLN
INTRAMUSCULAR | Status: AC
  Filled 2021-10-22: qty 2

## 2021-10-22 MED ORDER — MIDAZOLAM HCL 2 MG/2ML IJ SOLN
INTRAMUSCULAR | Status: DC | PRN
  Administered 2021-10-22 (×2): 1 mg via INTRAVENOUS

## 2021-10-22 MED ORDER — PROPOFOL 10 MG/ML IV BOLUS
INTRAVENOUS | Status: DC | PRN
  Administered 2021-10-22: 120 mg via INTRAVENOUS

## 2021-10-22 MED ORDER — LIDOCAINE HCL (PF) 2 % IJ SOLN
INTRAMUSCULAR | Status: AC
  Filled 2021-10-22: qty 5

## 2021-10-22 MED ORDER — FENTANYL CITRATE (PF) 100 MCG/2ML IJ SOLN
INTRAMUSCULAR | Status: DC | PRN
  Administered 2021-10-22 (×2): 25 ug via INTRAVENOUS

## 2021-10-22 MED ORDER — MIDAZOLAM HCL 2 MG/2ML IJ SOLN
INTRAMUSCULAR | Status: AC
  Filled 2021-10-22: qty 2

## 2021-10-22 MED ORDER — SUGAMMADEX SODIUM 200 MG/2ML IV SOLN
INTRAVENOUS | Status: DC | PRN
  Administered 2021-10-22: 380 mg via INTRAVENOUS

## 2021-10-22 MED ORDER — DEXAMETHASONE SODIUM PHOSPHATE 10 MG/ML IJ SOLN
INTRAMUSCULAR | Status: AC
  Filled 2021-10-22: qty 1

## 2021-10-22 MED ORDER — FENTANYL CITRATE (PF) 100 MCG/2ML IJ SOLN: 25.0000 ug | INTRAMUSCULAR | Status: DC | PRN

## 2021-10-22 MED ORDER — CHLORHEXIDINE GLUCONATE 0.12 % MT SOLN
OROMUCOSAL | Status: AC
  Administered 2021-10-22: 15 mL via OROMUCOSAL
  Filled 2021-10-22: qty 15

## 2021-10-22 MED ORDER — ACETAMINOPHEN 10 MG/ML IV SOLN
INTRAVENOUS | Status: DC | PRN
  Administered 2021-10-22: 1000 mg via INTRAVENOUS

## 2021-10-22 MED ORDER — PROPOFOL 10 MG/ML IV BOLUS
INTRAVENOUS | Status: AC
  Filled 2021-10-22: qty 40

## 2021-10-22 MED ORDER — KETOROLAC TROMETHAMINE 30 MG/ML IJ SOLN
INTRAMUSCULAR | Status: DC | PRN
  Administered 2021-10-22: 30 mg via INTRAVENOUS

## 2021-10-22 MED ORDER — ROCURONIUM BROMIDE 100 MG/10ML IV SOLN
INTRAVENOUS | Status: DC | PRN
  Administered 2021-10-22: 40 mg via INTRAVENOUS

## 2021-10-22 MED ORDER — ACETAMINOPHEN 10 MG/ML IV SOLN
INTRAVENOUS | Status: AC
  Filled 2021-10-22: qty 100

## 2021-10-22 MED ORDER — IOHEXOL 180 MG/ML  SOLN
INTRAMUSCULAR | Status: DC | PRN
  Administered 2021-10-22: 10 mL

## 2021-10-22 MED ORDER — DEXAMETHASONE SODIUM PHOSPHATE 10 MG/ML IJ SOLN
INTRAMUSCULAR | Status: DC | PRN
  Administered 2021-10-22: 10 mg via INTRAVENOUS

## 2021-10-22 MED ORDER — OXYCODONE HCL 5 MG PO TABS: 5.0000 mg | ORAL_TABLET | Freq: Once | ORAL | Status: DC | PRN

## 2021-10-22 MED ORDER — ONDANSETRON HCL 4 MG/2ML IJ SOLN
INTRAMUSCULAR | Status: AC
  Filled 2021-10-22: qty 2

## 2021-10-22 MED ORDER — ROCURONIUM BROMIDE 10 MG/ML (PF) SYRINGE
PREFILLED_SYRINGE | INTRAVENOUS | Status: AC
  Filled 2021-10-22: qty 10

## 2021-10-22 MED ORDER — ONDANSETRON HCL 4 MG/2ML IJ SOLN
INTRAMUSCULAR | Status: DC | PRN
  Administered 2021-10-22: 4 mg via INTRAVENOUS

## 2021-10-22 MED ORDER — DROPERIDOL 2.5 MG/ML IJ SOLN: 0.6250 mg | Freq: Once | INTRAMUSCULAR | Status: DC | PRN

## 2021-10-22 MED ORDER — OXYCODONE HCL 5 MG/5ML PO SOLN: 5.0000 mg | Freq: Once | ORAL | Status: DC | PRN

## 2021-10-22 MED ORDER — PHENYLEPHRINE HCL (PRESSORS) 10 MG/ML IV SOLN
INTRAVENOUS | Status: DC | PRN
  Administered 2021-10-22: 80 ug via INTRAVENOUS

## 2021-10-22 MED ORDER — OXYBUTYNIN CHLORIDE ER 10 MG PO TB24: 10.0000 mg | ORAL_TABLET | Freq: Every day | ORAL | 0 refills | Status: AC | PRN

## 2021-10-22 MED ORDER — CIPROFLOXACIN IN D5W 400 MG/200ML IV SOLN
INTRAVENOUS | Status: AC
  Filled 2021-10-22: qty 200

## 2021-10-22 MED ORDER — SODIUM CHLORIDE 0.9 % IR SOLN
Status: DC | PRN
  Administered 2021-10-22: 1000 mL

## 2021-10-22 MED ORDER — PROMETHAZINE HCL 25 MG/ML IJ SOLN: 6.2500 mg | INTRAMUSCULAR | Status: DC | PRN

## 2021-10-22 MED ORDER — FAMOTIDINE 20 MG PO TABS
ORAL_TABLET | ORAL | Status: AC
  Administered 2021-10-22: 20 mg via ORAL
  Filled 2021-10-22: qty 1

## 2021-10-22 MED ORDER — OXYCODONE HCL 5 MG PO TABS: 5.0000 mg | ORAL_TABLET | Freq: Four times a day (QID) | ORAL | 0 refills | Status: DC | PRN

## 2021-10-22 MED ORDER — ACETAMINOPHEN 10 MG/ML IV SOLN: 1000.0000 mg | Freq: Once | INTRAVENOUS | Status: DC | PRN

## 2021-10-22 MED ORDER — LIDOCAINE HCL (CARDIAC) PF 100 MG/5ML IV SOSY
PREFILLED_SYRINGE | INTRAVENOUS | Status: DC | PRN
  Administered 2021-10-22: 60 mg via INTRAVENOUS

## 2021-10-22 SURGICAL SUPPLY — 32 items
ADH LQ OCL WTPRF AMP STRL LF (MISCELLANEOUS)
ADHESIVE MASTISOL STRL (MISCELLANEOUS) IMPLANT
BAG DRAIN CYSTO-URO LG1000N (MISCELLANEOUS) ×2 IMPLANT
BAG PRESSURE INF REUSE 3000 (BAG) ×2 IMPLANT
BRUSH SCRUB EZ 1% IODOPHOR (MISCELLANEOUS) ×2 IMPLANT
CATH URET FLEX-TIP 2 LUMEN 10F (CATHETERS) ×1 IMPLANT
CATH URETL OPEN 5X70 (CATHETERS) IMPLANT
CNTNR SPEC 2.5X3XGRAD LEK (MISCELLANEOUS)
CONT SPEC 4OZ STER OR WHT (MISCELLANEOUS)
CONT SPEC 4OZ STRL OR WHT (MISCELLANEOUS)
CONTAINER SPEC 2.5X3XGRAD LEK (MISCELLANEOUS) IMPLANT
DRAPE UTILITY 15X26 TOWEL STRL (DRAPES) ×2 IMPLANT
DRSG TEGADERM 2-3/8X2-3/4 SM (GAUZE/BANDAGES/DRESSINGS) IMPLANT
FIBER LASER MOSES 200 DFL (Laser) ×1 IMPLANT
GLOVE SURG UNDER POLY LF SZ7.5 (GLOVE) ×2 IMPLANT
GOWN STRL REUS W/ TWL LRG LVL3 (GOWN DISPOSABLE) ×1 IMPLANT
GOWN STRL REUS W/ TWL XL LVL3 (GOWN DISPOSABLE) ×1 IMPLANT
GOWN STRL REUS W/TWL LRG LVL3 (GOWN DISPOSABLE) ×2
GOWN STRL REUS W/TWL XL LVL3 (GOWN DISPOSABLE) ×2
GUIDEWIRE STR DUAL SENSOR (WIRE) ×2 IMPLANT
IV NS IRRIG 3000ML ARTHROMATIC (IV SOLUTION) ×2 IMPLANT
KIT TURNOVER CYSTO (KITS) ×2 IMPLANT
PACK CYSTO AR (MISCELLANEOUS) ×2 IMPLANT
SET CYSTO W/LG BORE CLAMP LF (SET/KITS/TRAYS/PACK) ×2 IMPLANT
SHEATH NAVIGATOR HD 12/14X36 (SHEATH) IMPLANT
STENT URET 6FRX24 CONTOUR (STENTS) IMPLANT
STENT URET 6FRX26 CONTOUR (STENTS) IMPLANT
SURGILUBE 2OZ TUBE FLIPTOP (MISCELLANEOUS) ×2 IMPLANT
SYR 10ML LL (SYRINGE) ×2 IMPLANT
TRACTIP FLEXIVA PULSE ID 200 (Laser) IMPLANT
VALVE UROSEAL ADJ ENDO (VALVE) ×1 IMPLANT
WATER STERILE IRR 500ML POUR (IV SOLUTION) ×2 IMPLANT

## 2021-10-22 NOTE — H&P (Signed)
   10/22/21 11:03 AM   Audrey James 1950-07-05 [id number]  CC: hematuria, left ureteral stone  HPI: 71 year old female who presented with gross hematuria, and CT urogram showed a 1 cm left UPJ stone with mild hydronephrosis.  Interestingly there was a small calcification noted in the similar location in April 2022.  She continues to deny any pain, but has some ongoing hematuria.  I personally viewed and interpreted the images and agree with this finding of a 1 cm left UPJ stone, and there are no filling defects.  There is also a 1 cm soft tissue density in the anterior uterine corpus, and follow-up transvaginal pelvic ultrasound was recommended..  She opted for ureteroscopy and laser lithotripsy.     PMH: Past Medical History:  Diagnosis Date   Chronic seasonal allergic rhinitis due to pollen    and hives at times   colon cancer 1984   Headache    History of colon cancer 1984   History of kidney stones    Hyperlipidemia    MDD (major depressive disorder), recurrent episode, mild ([facility])    Obesity (BMI 30.0-34.9)    Sleep disorder     Surgical History: Past Surgical History:  Procedure Laterality Date   BREAST EXCISIONAL BIOPSY Right 1999   neg   BREAST SURGERY  1999   Biopsy--negative   CATARACT EXTRACTION W/ INTRAOCULAR LENS IMPLANT Bilateral 03/2021   CESAREAN SECTION  1990   COLON SURGERY  1984   Partial colectomy   COLONOSCOPY W/ POLYPECTOMY     has them done every 5 years last one 2020    Family History: Family History  Problem Relation Age of Onset   Ovarian cancer Maternal Aunt 57   Dementia Mother    Hypertension Mother    Diabetes Mother    Heart disease Father    Cancer Paternal Grandmother    Colon cancer Paternal Grandfather     Social History:  reports that she has never smoked. She has never been exposed to tobacco smoke. She has never used smokeless tobacco. She reports current alcohol use. She reports that she does not use drugs.  Physical  Exam: BP (!) 155/97   Pulse 72   Temp 98.2 F (36.8 C) (Temporal)   Resp 16   Ht 5\' 7"  (1.702 m)   Wt 88.4 kg   SpO2 96%   BMI 30.51 kg/m    Constitutional:  Alert and oriented, No acute distress. Cardiovascular: Regular rate and rhythm Respiratory: Clear to auscultation bilaterally GI: Abdomen is soft, nontender, nondistended, no abdominal masses   Laboratory Data: Culture 6/13 no growth  Assessment & Plan:   71 year old female who presented with asymptomatic gross hematuria and was found to have an 8 mm left UPJ stone.  We specifically discussed the risks ureteroscopy including bleeding, infection/sepsis, stent related symptoms including flank pain/urgency/frequency/incontinence/dysuria, ureteral injury, inability to access stone, or need for staged or additional procedures.  We discussed possible need for biopsy or resection of any abnormal bladder lesions if found at the time of surgery.  Left ureteroscopy, laser lithotripsy, stent placement today  Legrand Rams, MD 10/22/2021  [facility] [address] [place], [place] [address] [phone]

## 2021-10-22 NOTE — Anesthesia Postprocedure Evaluation (Signed)
Anesthesia Post Note  Patient: Audrey James  Procedure(s) Performed: CYSTOSCOPY/URETEROSCOPY/HOLMIUM LASER/STENT PLACEMENT (Left)  Patient location during evaluation: PACU Anesthesia Type: General Level of consciousness: awake and alert Pain management: pain level controlled Vital Signs Assessment: post-procedure vital signs reviewed and stable Respiratory status: spontaneous breathing, nonlabored ventilation and respiratory function stable Cardiovascular status: blood pressure returned to baseline and stable Postop Assessment: no apparent nausea or vomiting Anesthetic complications: no   No notable events documented.   Last Vitals:  Vitals:   10/22/21 1255 10/22/21 1330  BP: (!) 143/83 (!) 144/84  Pulse: (!) 16 62  Resp: 20 18  Temp: 36.6 C   SpO2: 95% 97%    Last Pain:  Vitals:   10/22/21 1330  TempSrc:   PainSc: 0-No pain                 Foye Deer

## 2021-10-22 NOTE — Anesthesia Preprocedure Evaluation (Addendum)
Anesthesia Evaluation  Patient identified by MRN, date of birth, ID band Patient awake    Reviewed: Allergy & Precautions, NPO status , Patient's Chart, lab work & pertinent test results  Airway Mallampati: III  TM Distance: >3 FB Neck ROM: full    Dental no notable dental hx.    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/21: Left anterior fascicular block   Neuro/Psych  Headaches, PSYCHIATRIC DISORDERS Depression    GI/Hepatic negative GI ROS, Neg liver ROS,   Endo/Other  negative endocrine ROS  Renal/GU Left Ureteral Stone     Musculoskeletal   Abdominal (+) + obese,   Peds  Hematology negative hematology ROS (+)   Anesthesia Other Findings Past Medical History: No date: Chronic seasonal allergic rhinitis due to pollen     Comment:  and hives at times 1984: colon cancer No date: Headache 1984: History of colon cancer No date: History of kidney stones No date: Hyperlipidemia No date: MDD (major depressive disorder), recurrent episode, mild  ([facility]) No date: Obesity (BMI 30.0-34.9) No date: Sleep disorder  Past Surgical History: 1999: BREAST EXCISIONAL BIOPSY; Right     Comment:  neg 1999: BREAST SURGERY     Comment:  Biopsy--negative 03/2021: CATARACT EXTRACTION W/ INTRAOCULAR LENS IMPLANT; Bilateral 1990: CESAREAN SECTION 1984: COLON SURGERY     Comment:  Partial colectomy No date: COLONOSCOPY W/ POLYPECTOMY     Comment:  has them done every 5 years last one 2020     Reproductive/Obstetrics negative OB ROS                            Anesthesia Physical Anesthesia Plan  ASA: 2  Anesthesia Plan: General   Post-op Pain Management: Toradol IV (intra-op)* and Ofirmev IV (intra-op)*   Induction: Intravenous  PONV Risk Score and Plan: 3 and 4 or greater and Dexamethasone,  Ondansetron, Midazolam and Treatment may vary due to age or medical condition  Airway Management Planned: Oral ETT  Additional Equipment:   Intra-op Plan:   Post-operative Plan: Extubation in OR  Informed Consent: I have reviewed the patients History and Physical, chart, labs and discussed the procedure including the risks, benefits and alternatives for the proposed anesthesia with the patient or authorized representative who has indicated his/her understanding and acceptance.     Dental Advisory Given  Plan Discussed with: Anesthesiologist, CRNA and Surgeon  Anesthesia Plan Comments:        Anesthesia Quick Evaluation

## 2021-10-25 ENCOUNTER — Ambulatory Visit: Payer: BC Managed Care – PPO

## 2021-11-01 ENCOUNTER — Ambulatory Visit (INDEPENDENT_AMBULATORY_CARE_PROVIDER_SITE_OTHER): Payer: BC Managed Care – PPO | Admitting: Urology

## 2021-11-01 ENCOUNTER — Encounter: Payer: Self-pay | Admitting: Urology

## 2021-11-01 VITALS — BP 137/89 | HR 86 | Ht 67.0 in | Wt 194.0 lb

## 2021-11-01 DIAGNOSIS — Z466 Encounter for fitting and adjustment of urinary device: Secondary | ICD-10-CM | POA: Diagnosis not present

## 2021-11-01 LAB — URINALYSIS, COMPLETE
Bilirubin, UA: NEGATIVE
Glucose, UA: NEGATIVE
Ketones, UA: NEGATIVE
Nitrite, UA: NEGATIVE
Specific Gravity, UA: 1.03 (ref 1.005–1.030)
Urobilinogen, Ur: 0.2 mg/dL (ref 0.2–1.0)
pH, UA: 5 (ref 5.0–7.5)

## 2021-11-01 LAB — MICROSCOPIC EXAMINATION: RBC, Urine: >30 /hpf — AB (ref 0–2)

## 2021-11-01 MED ORDER — SULFAMETHOXAZOLE-TRIMETHOPRIM 800-160 MG PO TABS
1.0000 | ORAL_TABLET | Freq: Once | ORAL | Status: AC
  Administered 2021-11-01: 1 via ORAL

## 2021-11-01 NOTE — Addendum Note (Signed)
Addended by: Donalee Citrin on: 11/01/2021 02:52 PM   Modules accepted: Orders

## 2021-11-01 NOTE — Progress Notes (Signed)
Cystoscopy Procedure Note:  Indication: Stent removal s/p left ureteroscopy, laser lithotripsy, stent placement on 10/22/2021  After informed consent and discussion of the procedure and its risks, Audrey James was positioned and prepped in the standard fashion. Cystoscopy was performed with a flexible cystoscope. The stent was grasped with flexible graspers and removed in its entirety. The patient tolerated the procedure well.  Findings: Uncomplicated stent removal  Assessment and Plan: RTC 1 year KUB prior  Billey Co, MD 11/01/2021

## 2021-11-01 NOTE — Patient Instructions (Signed)
Dietary Guidelines to Help Prevent Kidney Stones Kidney stones are deposits of minerals and salts that form inside your kidneys. Your risk of developing kidney stones may be greater depending on your diet, your lifestyle, the medicines you take, and whether you have certain medical conditions. Most people can lower their chances of developing kidney stones by following the instructions below. Your dietitian may give you more specific instructions depending on your overall health and the type of kidney stones you tend to develop. What are tips for following this plan? Reading food labels  Choose foods with "no salt added" or "low-salt" labels. Limit your salt (sodium) intake to less than 1,500 mg a day. Choose foods with calcium for each meal and snack. Try to eat about 300 mg of calcium at each meal. Foods that contain 200-500 mg of calcium a serving include: 8 oz (237 mL) of milk, calcium-fortifiednon-dairy milk, and calcium-fortifiedfruit juice. Calcium-fortified means that calcium has been added to these drinks. 8 oz (237 mL) of kefir, yogurt, and soy yogurt. 4 oz (114 g) of tofu. 1 oz (28 g) of cheese. 1 cup (150 g) of dried figs. 1 cup (91 g) of cooked broccoli. One 3 oz (85 g) can of sardines or mackerel. Most people need 1,000-1,500 mg of calcium a day. Talk to your dietitian about how much calcium is recommended for you. Shopping Buy plenty of fresh fruits and vegetables. Most people do not need to avoid fruits and vegetables, even if these foods contain nutrients that may contribute to kidney stones. When shopping for convenience foods, choose: Whole pieces of fruit. Pre-made salads with dressing on the side. Low-fat fruit and yogurt smoothies. Avoid buying frozen meals or prepared deli foods. These can be high in sodium. Look for foods with live cultures, such as yogurt and kefir. Choose high-fiber grains, such as whole-wheat breads, oat bran, and wheat cereals. Cooking Do not add  salt to food when cooking. Place a salt shaker on the table and allow each person to add his or her own salt to taste. Use vegetable protein, such as beans, textured vegetable protein (TVP), or tofu, instead of meat in pasta, casseroles, and soups. Meal planning Eat less salt, if told by your dietitian. To do this: Avoid eating processed or pre-made food. Avoid eating fast food. Eat less animal protein, including cheese, meat, poultry, or fish, if told by your dietitian. To do this: Limit the number of times you have meat, poultry, fish, or cheese each week. Eat a diet free of meat at least 2 days a week. Eat only one serving each day of meat, poultry, fish, or seafood. When you prepare animal protein, cut pieces into small portion sizes. For most meat and fish, one serving is about the size of the palm of your hand. Eat at least five servings of fresh fruits and vegetables each day. To do this: Keep fruits and vegetables on hand for snacks. Eat one piece of fruit or a handful of berries with breakfast. Have a salad and fruit at lunch. Have two kinds of vegetables at dinner. Limit foods that are high in a substance called oxalate. These include: Spinach (cooked), rhubarb, beets, sweet potatoes, and Swiss chard. Peanuts. Potato chips, french fries, and baked potatoes with skin on. Nuts and nut products. Chocolate. If you regularly take a diuretic medicine, make sure to eat at least 1 or 2 servings of fruits or vegetables that are high in potassium each day. These include: Avocado. Banana. Orange, prune,   carrot, or tomato juice. Baked potato. Cabbage. Beans and split peas. Lifestyle  Drink enough fluid to keep your urine pale yellow. This is the most important thing you can do. Spread your fluid intake throughout the day. If you drink alcohol: Limit how much you use to: 0-1 drink a day for women who are not pregnant. 0-2 drinks a day for men. Be aware of how much alcohol is in your  drink. In the [place] one drink equals one 12 oz bottle of beer (355 mL), one 5 oz glass of wine (148 mL), or one 1 oz glass of hard liquor (44 mL). Lose weight if told by your health care provider. Work with your dietitian to find an eating plan and weight loss strategies that work best for you. General information Talk to your health care provider and dietitian about taking daily supplements. You may be told the following depending on your health and the cause of your kidney stones: Not to take supplements with vitamin C. To take a calcium supplement. To take a daily probiotic supplement. To take other supplements such as magnesium, fish oil, or vitamin B6. Take over-the-counter and prescription medicines only as told by your health care provider. These include supplements. What foods should I limit? Limit your intake of the following foods, or eat them as told by your dietitian. Vegetables Spinach. Rhubarb. Beets. Canned vegetables. Pickles. Olives. Baked potatoes with skin. Grains Wheat bran. Baked goods. Salted crackers. Cereals high in sugar. Meats and other proteins Nuts. Nut butters. Large portions of meat, poultry, or fish. Salted, precooked, or cured meats, such as sausages, meat loaves, and hot dogs. Dairy Cheese. Beverages Regular soft drinks. Regular vegetable juice. Seasonings and condiments Seasoning blends with salt. Salad dressings. Soy sauce. Ketchup. Barbecue sauce. Other foods Canned soups. Canned pasta sauce. Casseroles. Pizza. Lasagna. Frozen meals. Potato chips. French fries. The items listed above may not be a complete list of foods and beverages you should limit. Contact a dietitian for more information. What foods should I avoid? Talk to your dietitian about specific foods you should avoid based on the type of kidney stones you have and your overall health. Fruits Grapefruit. The item listed above may not be a complete list of foods and beverages you should  avoid. Contact a dietitian for more information. Summary Kidney stones are deposits of minerals and salts that form inside your kidneys. You can lower your risk of kidney stones by making changes to your diet. The most important thing you can do is drink enough fluid. Drink enough fluid to keep your urine pale yellow. Talk to your dietitian about how much calcium you should have each day, and eat less salt and animal protein as told by your dietitian. This information is not intended to replace advice given to you by your health care provider. Make sure you discuss any questions you have with your health care provider. Document Revised: 12/28/2020 Document Reviewed: 12/28/2020 Elsevier Patient Education  2023 Elsevier Inc.  

## 2022-03-03 ENCOUNTER — Ambulatory Visit: Payer: Self-pay

## 2022-03-03 ENCOUNTER — Ambulatory Visit
Admission: RE | Admit: 2022-03-03 | Discharge: 2022-03-03 | Disposition: A | Discharge status: Home / Self Care | Payer: Medicare Other | Source: Ambulatory Visit | Attending: Urgent Care | Admitting: Urgent Care

## 2022-03-03 VITALS — BP 134/98 | HR 65 | Temp 97.9°F | Resp 15

## 2022-03-03 DIAGNOSIS — B9689 Other specified bacterial agents as the cause of diseases classified elsewhere: Secondary | ICD-10-CM | POA: Diagnosis not present

## 2022-03-03 DIAGNOSIS — H109 Unspecified conjunctivitis: Secondary | ICD-10-CM | POA: Diagnosis not present

## 2022-03-03 MED ORDER — POLYMYXIN B-TRIMETHOPRIM 10000-0.1 UNIT/ML-% OP SOLN: 1.0000 [drp] | Freq: Four times a day (QID) | OPHTHALMIC | 0 refills | Status: AC

## 2022-03-03 NOTE — ED Triage Notes (Signed)
Pt. Presents to UC w/ c/o bilateral eye redness and drainage for the past 3 days.

## 2022-03-03 NOTE — Discharge Instructions (Addendum)
Follow up here or with your primary care provider if your symptoms are worsening or not improving with treatment.     

## 2022-03-03 NOTE — ED Provider Notes (Signed)
[facility]    CSN: [id number] Arrival date & time: 03/03/22  1350      History   Chief Complaint Chief Complaint  Patient presents with   Conjunctivitis    Entered by patient    HPI Audrey James is a 71 y.o. female.    Conjunctivitis    Presents to UC with complaint of bilateral eye redness and drainage x3 days.  She states she wakes with matted eyes and continuously wipes away thick yellow drainage.  Past Medical History:  Diagnosis Date   Chronic seasonal allergic rhinitis due to pollen    and hives at times   colon cancer 1984   Headache    History of colon cancer 1984   History of kidney stones    Hyperlipidemia    MDD (major depressive disorder), recurrent episode, mild (HCC)    Obesity (BMI 30.0-34.9)    Sleep disorder     Patient Active Problem List   Diagnosis Date Noted   History of kidney stones 05/13/2021   Preventative health care 02/27/2020   Obesity (BMI 30.0-34.9)    MDD (major depressive disorder), recurrent episode, mild ([place])    Sleep disorder    Hyperlipidemia    Chronic seasonal allergic rhinitis due to pollen    Osteopenia of multiple sites 03/02/2017   Mild episode of recurrent major depressive disorder ([place]) 05/08/2014   History of colon cancer 1984    Past Surgical History:  Procedure Laterality Date   BREAST EXCISIONAL BIOPSY Right 1999   neg   [place]   Biopsy--negative   CATARACT EXTRACTION W/ INTRAOCULAR LENS IMPLANT Bilateral 03/2021   CESAREAN SECTION  1990   COLON SURGERY  1984   Partial colectomy   COLONOSCOPY W/ POLYPECTOMY     has them done every 5 years last one 2020   CYSTOSCOPY/URETEROSCOPY/HOLMIUM LASER/STENT PLACEMENT Left 10/22/2021   Procedure: CYSTOSCOPY/URETEROSCOPY/HOLMIUM LASER/STENT PLACEMENT;  Surgeon: Billey Co, MD;  Location: [facility];  Service: Urology;  Laterality: Left;    OB History   No obstetric history on file.      Home Medications    Prior to  Admission medications   Medication Sig Start Date End Date Taking? Authorizing Provider  buPROPion (WELLBUTRIN SR) 150 MG 12 hr tablet TAKE 1 TABLET BY MOUTH TWICE DAILY 08/18/21   Venia Carbon, MD  cetirizine (ZYRTEC) 10 MG tablet Take 10 mg by mouth as needed for allergies.    [provider]  escitalopram (LEXAPRO) 20 MG tablet TAKE 1 TABLET BY MOUTH DAILY 07/02/21   Viviana Simpler I, MD  naproxen sodium (ALEVE) 220 MG tablet Take 220 mg by mouth. 2 tablets    [provider]  oxyCODONE (ROXICODONE) 5 MG immediate release tablet Take 1 tablet (5 mg total) by mouth every 6 (six) hours as needed for severe pain. 10/22/21   Billey Co, MD    Family History Family History  Problem Relation Age of Onset   Ovarian cancer Maternal Aunt 36   Dementia Mother    Hypertension Mother    Diabetes Mother    Heart disease Father    Cancer Paternal Grandmother    Colon cancer Paternal Grandfather     Social History Social History   Tobacco Use   Smoking status: Never    Passive exposure: Never   Smokeless tobacco: Never  Substance Use Topics   Alcohol use: Yes    Comment: rare glass of wine  Drug use: Never     Allergies   Cefaclor   Review of Systems Review of Systems   Physical Exam Triage Vital Signs ED Triage Vitals  Enc Vitals Group     BP 03/03/22 1357 (!) 134/98     Pulse Rate 03/03/22 1357 65     Resp 03/03/22 1357 15     Temp 03/03/22 1357 97.9 F (36.6 C)     Temp src --      SpO2 03/03/22 1357 96 %     Weight --      Height --      Head Circumference --      Peak Flow --      Pain Score 03/03/22 1358 0     Pain Loc --      Pain Edu? --      Excl. in [place]? --    No data found.  Updated Vital Signs BP (!) 134/98   Pulse 65   Temp 97.9 F (36.6 C)   Resp 15   SpO2 96%   Visual Acuity Right Eye Distance:   Left Eye Distance:   Bilateral Distance:    Right Eye Near:   Left Eye Near:    Bilateral Near:     Physical  Exam Constitutional:      Appearance: Normal appearance.  Eyes:     General:        Right eye: Discharge present.        Left eye: Discharge present. Skin:    General: Skin is warm and dry.  Neurological:     General: No focal deficit present.     Mental Status: She is alert and oriented to person, place, and time.  Psychiatric:        Mood and Affect: Mood normal.        Behavior: Behavior normal.      UC Treatments / Results  Labs (all labs ordered are listed, but only abnormal results are displayed) Labs Reviewed - No data to display  EKG   Radiology No results found.  Procedures Procedures (including critical care time)  Medications Ordered in UC Medications - No data to display  Initial Impression / Assessment and Plan / UC Course  I have reviewed the triage vital signs and the nursing notes.  Pertinent labs & imaging results that were available during my care of the patient were reviewed by me and considered in my medical decision making (see chart for details).   Bilateral conjunctival injection with exudate present.   Final Clinical Impressions(s) / UC Diagnoses   Final diagnoses:  None   Discharge Instructions   None    ED Prescriptions   None    PDMP not reviewed this encounter.   Rose Phi, [place] 03/03/22 1402

## 2022-03-09 ENCOUNTER — Encounter: Payer: Self-pay | Admitting: Internal Medicine

## 2022-05-17 ENCOUNTER — Encounter: Payer: BC Managed Care – PPO | Admitting: Internal Medicine

## 2022-05-26 ENCOUNTER — Telehealth (INDEPENDENT_AMBULATORY_CARE_PROVIDER_SITE_OTHER): Payer: Medicare Other | Admitting: Internal Medicine

## 2022-05-26 ENCOUNTER — Encounter: Payer: Self-pay | Admitting: Internal Medicine

## 2022-05-26 VITALS — Temp 99.0°F

## 2022-05-26 DIAGNOSIS — U071 COVID-19: Secondary | ICD-10-CM | POA: Diagnosis not present

## 2022-05-26 MED ORDER — NIRMATRELVIR/RITONAVIR (PAXLOVID) TABLET (RENAL DOSING): 2.0000 | ORAL_TABLET | Freq: Two times a day (BID) | ORAL | 0 refills | Status: DC

## 2022-05-26 MED ORDER — NIRMATRELVIR/RITONAVIR (PAXLOVID) TABLET (RENAL DOSING): 2.0000 | ORAL_TABLET | Freq: Two times a day (BID) | ORAL | 0 refills | Status: AC

## 2022-05-26 NOTE — Addendum Note (Signed)
Addended by: Viviana Simpler I on: 05/26/2022 03:04 PM   Modules accepted: Orders

## 2022-05-26 NOTE — Progress Notes (Signed)
Subjective:    Patient ID: Audrey James, female    DOB: 09-Jan-1951, 72 y.o.   MRN: [id number]  HPI Video virtual visit due to COVID infection Identification done Reviewed limitations and billing and she gave consent Participants---patient in her home and I am in my office  Some cough earlier this week and some headache Granddaughter sick yesterday--when watching her (and had fever)---positive for COVID She tested yesterday and was negative--but was positive this morning Low grade fever and sore throat Congestion, drainage also Only a little cough--seems better than with infection last month No SOB--but deep breath causes cough Oxygen at 95%  Taking tylenol and advil alternately --for headache  Current Outpatient Medications on File Prior to Visit  Medication Sig Dispense Refill   buPROPion (WELLBUTRIN SR) 150 MG 12 hr tablet TAKE 1 TABLET BY MOUTH TWICE DAILY 180 tablet 3   cetirizine (ZYRTEC) 10 MG tablet Take 10 mg by mouth as needed for allergies.     escitalopram (LEXAPRO) 20 MG tablet TAKE 1 TABLET BY MOUTH DAILY 90 tablet 3   naproxen sodium (ALEVE) 220 MG tablet Take 220 mg by mouth. 2 tablets     No current facility-administered medications on file prior to visit.    Allergies  Allergen Reactions   Cefaclor Hives    Past Medical History:  Diagnosis Date   Chronic seasonal allergic rhinitis due to pollen    and hives at times   colon cancer 1984   Headache    History of colon cancer 1984   History of kidney stones    Hyperlipidemia    MDD (major depressive disorder), recurrent episode, mild (HCC)    Obesity (BMI 30.0-34.9)    Sleep disorder     Past Surgical History:  Procedure Laterality Date   BREAST EXCISIONAL BIOPSY Right 1999   neg   BREAST SURGERY  1999   Biopsy--negative   CATARACT EXTRACTION W/ INTRAOCULAR LENS IMPLANT Bilateral 03/2021   CESAREAN SECTION  1990   COLON SURGERY  1984   Partial colectomy   COLONOSCOPY W/ POLYPECTOMY     has  them done every 5 years last one 2020   CYSTOSCOPY/URETEROSCOPY/HOLMIUM LASER/STENT PLACEMENT Left 10/22/2021   Procedure: CYSTOSCOPY/URETEROSCOPY/HOLMIUM LASER/STENT PLACEMENT;  Surgeon: Billey Co, MD;  Location: [facility];  Service: Urology;  Laterality: Left;    Family History  Problem Relation Age of Onset   Ovarian cancer Maternal Aunt 15   Dementia Mother    Hypertension Mother    Diabetes Mother    Heart disease Father    Cancer Paternal Grandmother    Colon cancer Paternal Grandfather     Social History   Socioeconomic History   Marital status: Divorced    Spouse name: Not on file   Number of children: 1   Years of education: Not on file   Highest education level: Not on file  Occupational History   Occupation: Art therapist: [employer]   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  Tobacco Use   Smoking status: Never    Passive exposure: Never   Smokeless tobacco: Never  Substance and Sexual Activity   Alcohol use: Yes    Comment: rare glass of wine   Drug use: Never   Sexual activity: Not on file  Other Topics Concern   Not on file  Social History Narrative   Has living will   Daughter is health care POA   Would accept resuscitation  Not sure tube feeds   Social Determinants of Health   Financial Resource Strain: Not on file  Food Insecurity: Not on file  Transportation Needs: Not on file  Physical Activity: Not on file  Stress: Not on file  Social Connections: Not on file  Intimate Partner Violence: Not on file   Review of Systems No change in smell or taste Appetite is okay No rash    Objective:   Physical Exam Constitutional:      Appearance: Normal appearance.  Pulmonary:     Effort: Pulmonary effort is normal. No respiratory distress.  Neurological:     Mental Status: She is alert.            Assessment & Plan:

## 2022-05-26 NOTE — Assessment & Plan Note (Addendum)
Vague symptoms but clearly different today with exposure yesterday Discussed antivirals and she would like to proceed Tylenol, OTC cough syrup as needed Paxlovid--will do low dose Decrease wellbutrin to daily while on paxlovid To ER if sig shortness of breath

## 2022-06-17 ENCOUNTER — Ambulatory Visit
Admission: RE | Admit: 2022-06-17 | Discharge: 2022-06-17 | Disposition: A | Discharge status: Home / Self Care | Payer: Medicare Other | Source: Ambulatory Visit | Attending: Internal Medicine | Admitting: Internal Medicine

## 2022-06-17 DIAGNOSIS — A084 Viral intestinal infection, unspecified: Secondary | ICD-10-CM

## 2022-06-17 NOTE — ED Provider Notes (Signed)
Audrey James    CSN: [id number] Arrival date & time: 06/17/22  1741      History   Chief Complaint Chief Complaint  Patient presents with   Abdominal Pain    Vomiting and chills too - Entered by patient   Emesis   Chills    HPI Audrey James is a 72 y.o. female.    Abdominal Pain Associated symptoms: vomiting   Emesis Associated symptoms: abdominal pain     Presents to urgent care with complaint of abdominal pain, headache, chills x 2 days.  Vomiting starting today.  Patient has been using Phenergan for vomiting.  She has surgically absent appendix, partial colectomy.  Past Medical History:  Diagnosis Date   Chronic seasonal allergic rhinitis due to pollen    and hives at times   colon cancer 1984   Headache    History of colon cancer 1984   History of kidney stones    Hyperlipidemia    MDD (major depressive disorder), recurrent episode, mild (HCC)    Obesity (BMI 30.0-34.9)    Sleep disorder     Patient Active Problem List   Diagnosis Date Noted   COVID-19 virus infection 05/26/2022   History of kidney stones 05/13/2021   Preventative health care 02/27/2020   Obesity (BMI 30.0-34.9)    MDD (major depressive disorder), recurrent episode, mild ([place])    Sleep disorder    Hyperlipidemia    Chronic seasonal allergic rhinitis due to pollen    Osteopenia of multiple sites 03/02/2017   Mild episode of recurrent major depressive disorder ([place]) 05/08/2014   History of colon cancer 1984    Past Surgical History:  Procedure Laterality Date   BREAST EXCISIONAL BIOPSY Right 1999   neg   [place]   Biopsy--negative   CATARACT EXTRACTION W/ INTRAOCULAR LENS IMPLANT Bilateral 03/2021   CESAREAN SECTION  1990   COLON SURGERY  1984   Partial colectomy   COLONOSCOPY W/ POLYPECTOMY     has them done every 5 years last one 2020   CYSTOSCOPY/URETEROSCOPY/HOLMIUM LASER/STENT PLACEMENT Left 10/22/2021   Procedure: CYSTOSCOPY/URETEROSCOPY/HOLMIUM  LASER/STENT PLACEMENT;  Surgeon: Billey Co, MD;  Location: [facility];  Service: Urology;  Laterality: Left;    OB History   No obstetric history on file.      Home Medications    Prior to Admission medications   Medication Sig Start Date End Date Taking? Authorizing Provider  buPROPion (WELLBUTRIN SR) 150 MG 12 hr tablet TAKE 1 TABLET BY MOUTH TWICE DAILY 08/18/21   Venia Carbon, MD  cetirizine (ZYRTEC) 10 MG tablet Take 10 mg by mouth as needed for allergies.    [provider]  escitalopram (LEXAPRO) 20 MG tablet TAKE 1 TABLET BY MOUTH DAILY 07/02/21   Viviana Simpler I, MD  naproxen sodium (ALEVE) 220 MG tablet Take 220 mg by mouth. 2 tablets    [provider]    Family History Family History  Problem Relation Age of Onset   Ovarian cancer Maternal Aunt 53   Dementia Mother    Hypertension Mother    Diabetes Mother    Heart disease Father    Cancer Paternal Grandmother    Colon cancer Paternal Grandfather     Social History Social History   Tobacco Use   Smoking status: Never    Passive exposure: Never   Smokeless tobacco: Never  Substance Use Topics   Alcohol use: Yes    Comment: rare glass  of wine   Drug use: Never     Allergies   Cefaclor   Review of Systems Review of Systems  Gastrointestinal:  Positive for abdominal pain and vomiting.     Physical Exam Triage Vital Signs ED Triage Vitals  Enc Vitals Group     BP      Pulse      Resp      Temp      Temp src      SpO2      Weight      Height      Head Circumference      Peak Flow      Pain Score      Pain Loc      Pain Edu?      Excl. in [place]?    No data found.  Updated Vital Signs There were no vitals taken for this visit.  Visual Acuity Right Eye Distance:   Left Eye Distance:   Bilateral Distance:    Right Eye Near:   Left Eye Near:    Bilateral Near:     Physical Exam Vitals reviewed.  Constitutional:      Appearance: She is well-developed.   Abdominal:     General: Bowel sounds are normal.     Tenderness: There is abdominal tenderness in the right lower quadrant and left lower quadrant. There is no guarding. Negative signs include Murphy's sign.  Skin:    General: Skin is warm and dry.  Neurological:     General: No focal deficit present.     Mental Status: She is alert and oriented to person, place, and time.  Psychiatric:        Mood and Affect: Mood normal.        Behavior: Behavior normal.      UC Treatments / Results  Labs (all labs ordered are listed, but only abnormal results are displayed) Labs Reviewed - No data to display  EKG   Radiology No results found.  Procedures Procedures (including critical care time)  Medications Ordered in UC Medications - No data to display  Initial Impression / Assessment and Plan / UC Course  I have reviewed the triage vital signs and the nursing notes.  Pertinent labs & imaging results that were available during my care of the patient were reviewed by me and considered in my medical decision making (see chart for details).   Patient is afebrile here without recent antipyretics. Satting well on room air. Overall is well appearing, well hydrated, without respiratory distress.  Abdomen is soft with left and right lower tenderness.  No guarding.  No Murphy sign.  Surgically absent appendix.  Patient's symptoms are consistent with an acute viral process however cannot rule out other causes for her symptoms including diverticulitis for which she has no history.    Read to watch and wait.  She will go to the ED if she develops additional concerning signs and symptoms.  She will push hydration.  Final Clinical Impressions(s) / UC Diagnoses   Final diagnoses:  None   Discharge Instructions   None    ED Prescriptions   None    PDMP not reviewed this encounter.   Rose Phi, [place] 06/17/22 1835

## 2022-06-17 NOTE — Discharge Instructions (Addendum)
Follow up here or with your primary care provider if your symptoms are worsening or not improving.     

## 2022-06-17 NOTE — ED Triage Notes (Signed)
Patient presents to UC abdominal pain, HA, chills x 2 days. Vomiting since today. Taking phenergan for vomiting.

## 2022-09-28 ENCOUNTER — Other Ambulatory Visit: Payer: Self-pay | Admitting: Internal Medicine

## 2022-09-28 DIAGNOSIS — Z1231 Encounter for screening mammogram for malignant neoplasm of breast: Secondary | ICD-10-CM

## 2022-11-09 ENCOUNTER — Ambulatory Visit: Payer: BC Managed Care – PPO | Admitting: Urology

## 2022-11-29 LAB — LAB REPORT - SCANNED: EGFR: 60

## 2023-01-18 IMAGING — CT CT RENAL STONE PROTOCOL
2 of 4 series · 16 of 46 positions shown, 18 images · non-contrast
Comparison: CT June 20, 2011

CLINICAL DATA: Left flank pain with hematuria, renal stone
suspected.

EXAM:
CT ABDOMEN AND PELVIS WITHOUT CONTRAST
TECHNIQUE: Multidetector CT imaging of the abdomen and pelvis was performed
following the standard protocol without IV contrast.

[Series 2: stone full standard · axial · 0.73mm/px · z∈[-1219,-794]mm · 13 of 93 slices shown, 15 images]
[im 4/93  soft-tissue]
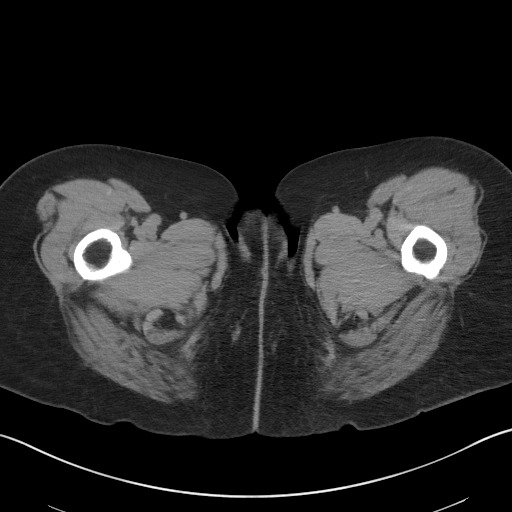
[im 4/93  bone]
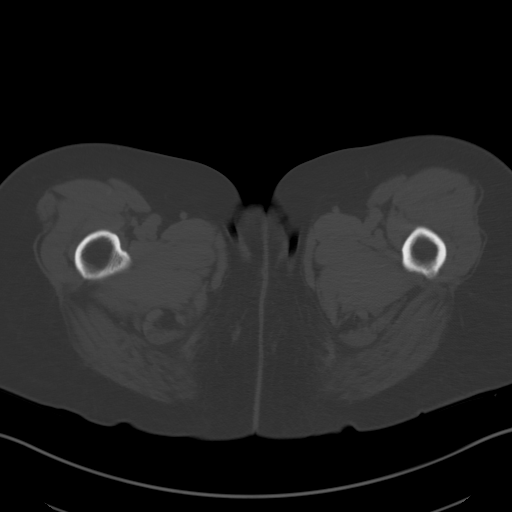
[im 12/93  soft-tissue]
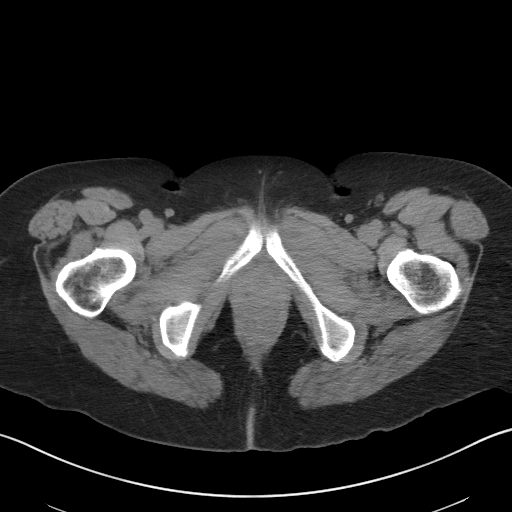
[im 19/93  soft-tissue]
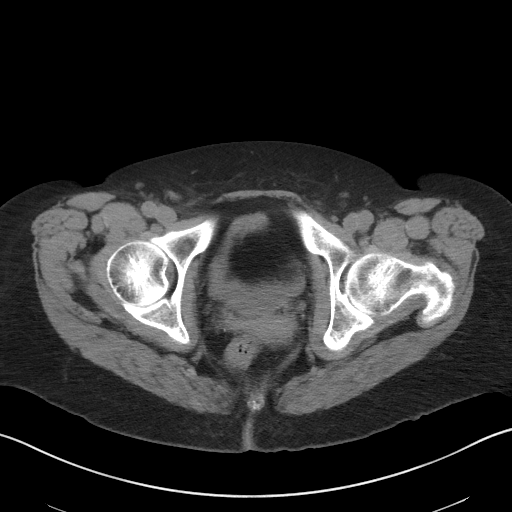
[im 26/93  soft-tissue]
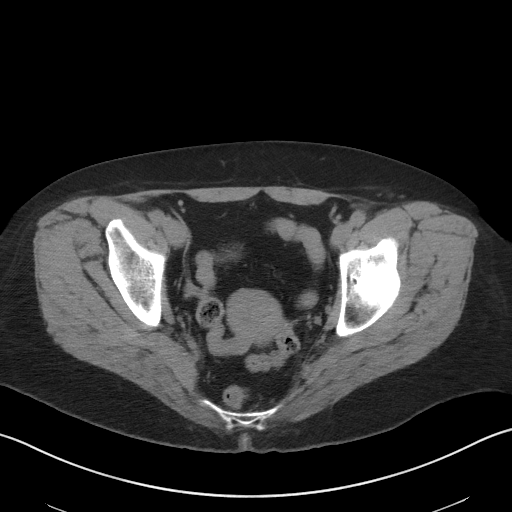
[im 34/93  soft-tissue]
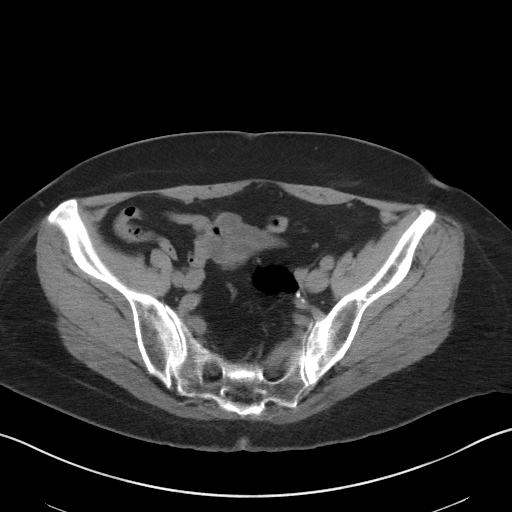
[im 41/93  soft-tissue]
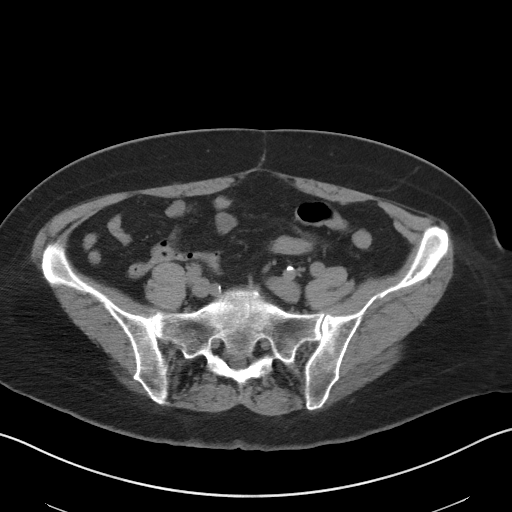
[im 48/93  soft-tissue]
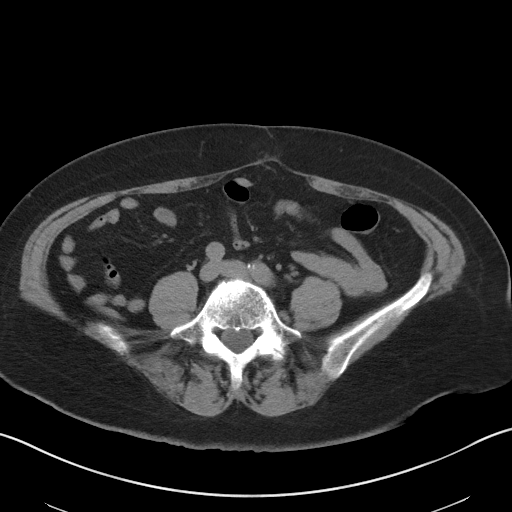
[im 52/93  soft-tissue]
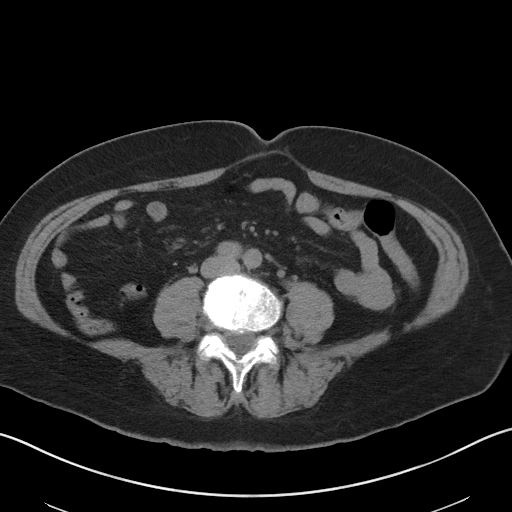
[im 59/93  soft-tissue]
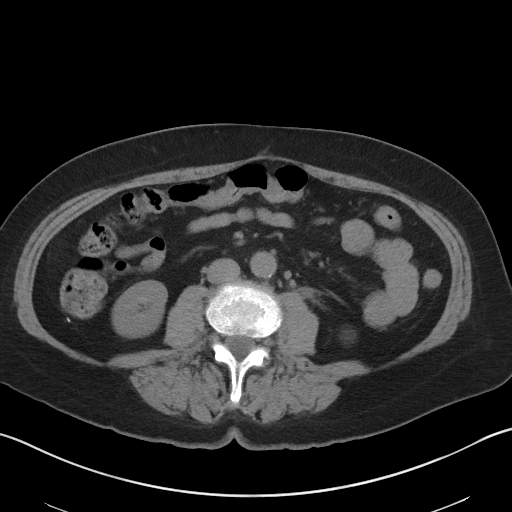
[im 59/93  bone]
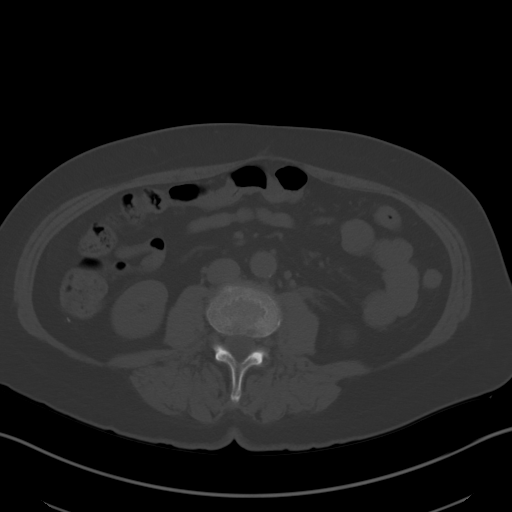
[im 67/93  soft-tissue]
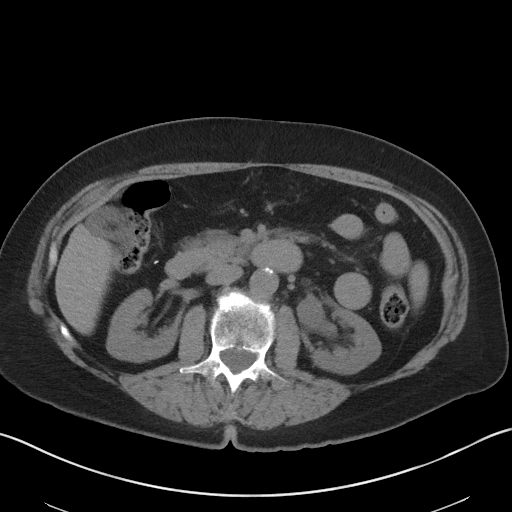
[im 74/93  soft-tissue]
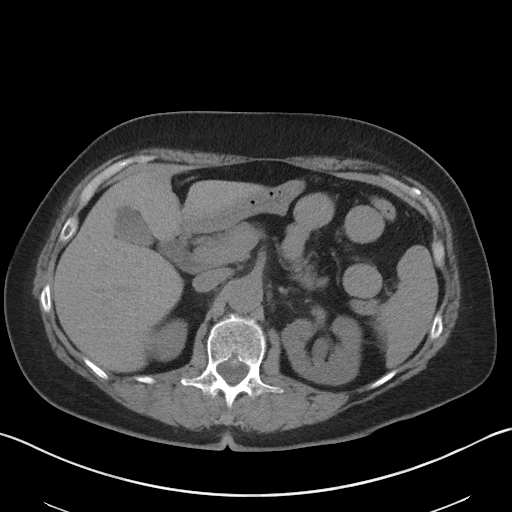
[im 81/93  soft-tissue]
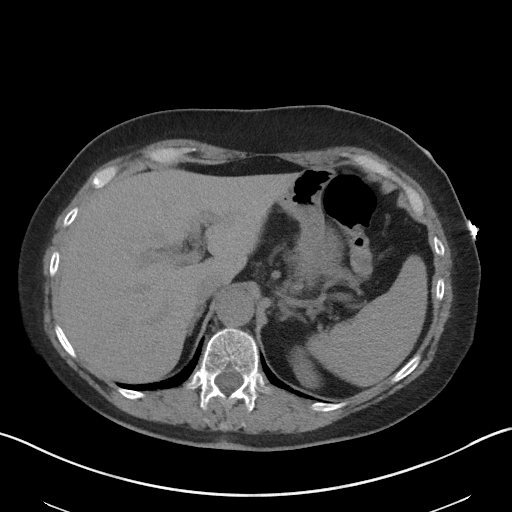
[im 89/93  soft-tissue]
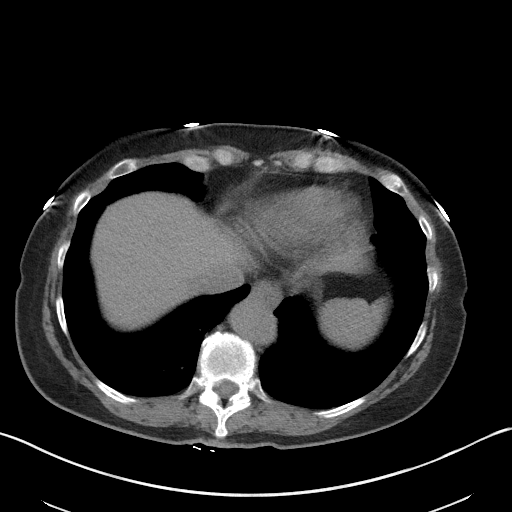

[Series 5: coronal · coronal · 0.77mm/px · 3 of 130 slices shown]
[im 44/130  soft-tissue]
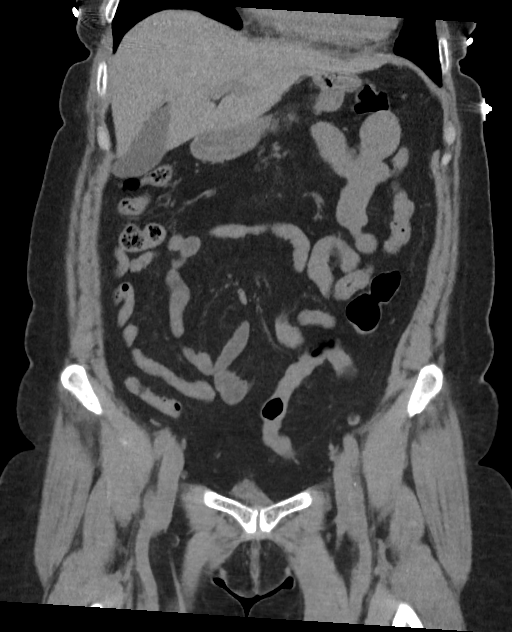
[im 58/130  soft-tissue]
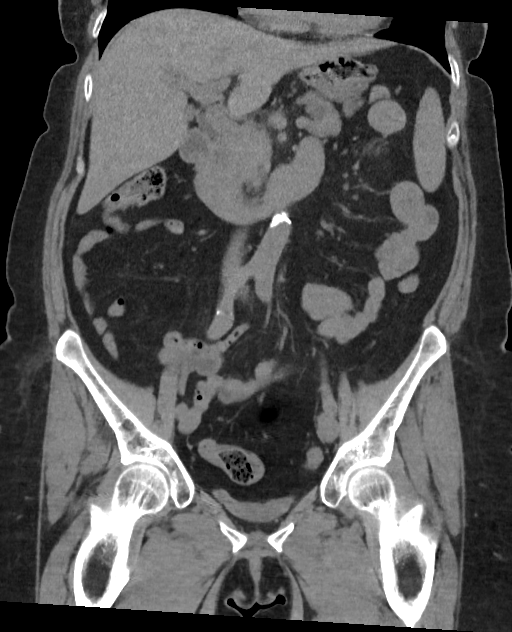
[im 72/130  soft-tissue]
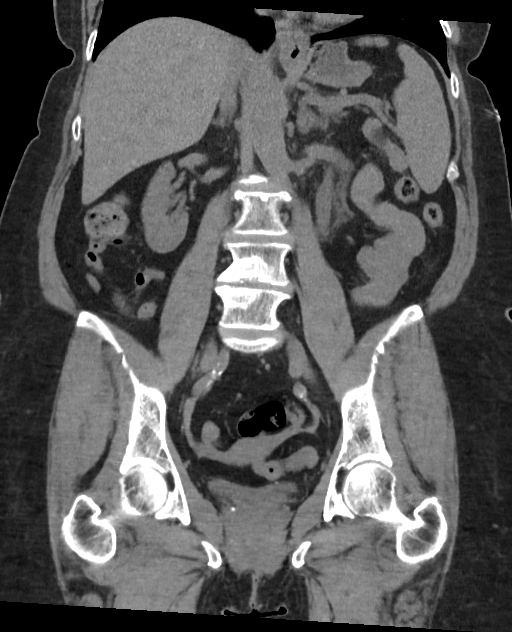

[16 of 46 positions shown; findings below may reference images not displayed]

FINDINGS: Lower chest: Bibasilar atelectasis. Unchanged size of a 3 mm
pulmonary nodule in the left lower lobe on image [DATE], considered
benign.

Hepatobiliary: 6 mm hypodensity in the right hepatic dome which is
similar to prior and technically too small to accurately
characterize but favored represent a benign entity such as
cyst/hemangioma. Otherwise unremarkable noncontrast appearance of
the hepatic parenchyma. Gallbladder is unremarkable. No biliary
ductal dilation.

Pancreas: Unremarkable. No pancreatic ductal dilatation or
surrounding inflammatory changes.

Spleen: Similar punctate calcification along the inferior border of
the spleen, likely sequela prior infection or trauma.

Adrenals/Urinary Tract: Bilateral adrenal glands are unremarkable.

Right kidney is unremarkable without hydronephrosis or contour
deforming renal mass.

Mild left hydroureteronephrosis to the level of a 2-3 mm stone in
the proximal ureter on image 37/2.

Urinary bladder is unremarkable for degree of distension.

Stomach/Bowel: Tiny hiatal hernia otherwise the stomach is grossly
unremarkable. No suspicious small bowel dilation. The appendix is
not definitely visualized however there is no pericecal
inflammation. No suspicious colonic wall thickening or mass like
lesions.

Vascular/Lymphatic: Aortic atherosclerosis. No enlarged abdominal or
pelvic lymph nodes.

Reproductive: Uterus and bilateral adnexa are unremarkable.

Other: No abdominopelvic ascites.

Musculoskeletal: Multilevel degenerative changes spine. No acute
osseous abnormality.
IMPRESSION: 1. Mild LEFT hydroureteronephrosis to the level of a 2-3 mm stone in
the proximal left ureter. No right-sided renal stones or
hydronephrosis.
2. Aortic atherosclerosis.

Aortic Atherosclerosis ([id number]).

## 2023-03-02 ENCOUNTER — Ambulatory Visit (INDEPENDENT_AMBULATORY_CARE_PROVIDER_SITE_OTHER): Payer: Medicare Other | Admitting: Physician Assistant

## 2023-03-02 ENCOUNTER — Encounter: Payer: Self-pay | Admitting: Physician Assistant

## 2023-03-02 VITALS — BP 131/94 | HR 76 | Temp 97.7°F | Resp 16 | Ht 67.0 in | Wt 188.0 lb

## 2023-03-02 DIAGNOSIS — F330 Major depressive disorder, recurrent, mild: Secondary | ICD-10-CM | POA: Diagnosis not present

## 2023-03-02 DIAGNOSIS — Z7689 Persons encountering health services in other specified circumstances: Secondary | ICD-10-CM

## 2023-03-02 DIAGNOSIS — I10 Essential (primary) hypertension: Secondary | ICD-10-CM

## 2023-03-02 DIAGNOSIS — E66811 Obesity, class 1: Secondary | ICD-10-CM

## 2023-03-02 DIAGNOSIS — Z833 Family history of diabetes mellitus: Secondary | ICD-10-CM | POA: Insufficient documentation

## 2023-03-02 DIAGNOSIS — R5383 Other fatigue: Secondary | ICD-10-CM

## 2023-03-03 ENCOUNTER — Encounter: Payer: Self-pay | Admitting: Physician Assistant

## 2023-03-03 LAB — CBC WITH DIFFERENTIAL/PLATELET
Basophils Absolute: 0.1 10*3/uL (ref 0.0–0.2)
Basos: 1 %
EOS (ABSOLUTE): 0.1 10*3/uL (ref 0.0–0.4)
Eos: 3 %
Hematocrit: 41.5 % (ref 34.0–46.6)
Hemoglobin: 13.7 g/dL (ref 11.1–15.9)
Immature Grans (Abs): 0 10*3/uL (ref 0.0–0.1)
Immature Granulocytes: 0 %
Lymphocytes Absolute: 1.2 10*3/uL (ref 0.7–3.1)
Lymphs: 26 %
MCH: 29.5 pg (ref 26.6–33.0)
MCHC: 33 g/dL (ref 31.5–35.7)
MCV: 89 fL (ref 79–97)
Monocytes Absolute: 0.6 10*3/uL (ref 0.1–0.9)
Monocytes: 12 %
Neutrophils Absolute: 2.8 10*3/uL (ref 1.4–7.0)
Neutrophils: 58 %
Platelets: 257 10*3/uL (ref 150–450)
RBC: 4.64 x10E6/uL (ref 3.77–5.28)
RDW: 11.8 % (ref 11.7–15.4)
WBC: 4.8 10*3/uL (ref 3.4–10.8)

## 2023-03-03 LAB — COMPREHENSIVE METABOLIC PANEL
ALT: 12 [IU]/L (ref 0–32)
AST: 22 [IU]/L (ref 0–40)
Albumin: 4.7 g/dL (ref 3.8–4.8)
Alkaline Phosphatase: 104 [IU]/L (ref 44–121)
BUN/Creatinine Ratio: 15 (ref 12–28)
BUN: 17 mg/dL (ref 8–27)
Bilirubin Total: 0.6 mg/dL (ref 0.0–1.2)
CO2: 23 mmol/L (ref 20–29)
Calcium: 9.7 mg/dL (ref 8.7–10.3)
Chloride: 102 mmol/L (ref 96–106)
Creatinine, Ser: 1.15 mg/dL — ABNORMAL HIGH (ref 0.57–1.00)
Globulin, Total: 2.1 g/dL (ref 1.5–4.5)
Glucose: 108 mg/dL — ABNORMAL HIGH (ref 70–99)
Potassium: 4.3 mmol/L (ref 3.5–5.2)
Sodium: 140 mmol/L (ref 134–144)
Total Protein: 6.8 g/dL (ref 6.0–8.5)
eGFR: 51 mL/min/{1.73_m2} — ABNORMAL LOW (ref >59)

## 2023-03-03 LAB — LIPID PANEL
Chol/HDL Ratio: 4.5 ratio — ABNORMAL HIGH (ref 0.0–4.4)
Cholesterol, Total: 230 mg/dL — ABNORMAL HIGH (ref 100–199)
HDL: 51 mg/dL (ref >39)
LDL Chol Calc (NIH): 160 mg/dL — ABNORMAL HIGH (ref 0–99)
Triglycerides: 104 mg/dL (ref 0–149)
VLDL Cholesterol Cal: 19 mg/dL (ref 5–40)

## 2023-03-03 LAB — TSH: TSH: 1.47 u[IU]/mL (ref 0.450–4.500)

## 2023-04-11 ENCOUNTER — Encounter: Payer: Self-pay | Admitting: Emergency Medicine

## 2023-04-11 DIAGNOSIS — F411 Generalized anxiety disorder: Secondary | ICD-10-CM | POA: Insufficient documentation

## 2023-04-12 ENCOUNTER — Ambulatory Visit: Payer: Medicare Other | Admitting: Emergency Medicine

## 2023-04-12 VITALS — Ht 67.0 in | Wt 179.0 lb

## 2023-04-12 DIAGNOSIS — Z0000 Encounter for general adult medical examination without abnormal findings: Secondary | ICD-10-CM

## 2023-04-12 DIAGNOSIS — Z780 Asymptomatic menopausal state: Secondary | ICD-10-CM

## 2023-04-12 NOTE — Patient Instructions (Addendum)
Audrey James , Thank you for taking time to come for your Medicare Wellness Visit. I appreciate your ongoing commitment to your health goals. Please review the following plan we discussed and let me know if I can assist you in the future.   Referrals/Orders/Follow-Ups/Clinician Recommendations: I have placed an order for a bone density test. Call [facility] @ [phone] to schedule. You may also be able to schedule your mammogram in the same visit.  This is a list of the screening recommended for you and due dates:  Health Maintenance  Topic Date Due   DEXA scan (bone density measurement)  03/02/2019   Mammogram  11/14/2020   COVID-19 Vaccine (4 - 2023-24 season) 01/01/2023   Colon Cancer Screening  07/16/2023   Medicare Annual Wellness Visit  04/11/2024   DTaP/Tdap/Td vaccine (3 - Td or Tdap) 10/10/2027   Pneumonia Vaccine  Completed   Flu Shot  Completed   Hepatitis C Screening  Completed   Zoster (Shingles) Vaccine  Completed   HPV Vaccine  Aged Out    Advanced directives: (Copy Requested) Please bring a copy of your health care power of attorney and living will to the office to be added to your chart at your convenience.  Next Medicare Annual Wellness Visit scheduled for next year: Yes, 04/17/24 @ 8:50am  Fall Prevention in the Home, Adult Falls can cause injuries and affect people of all ages. There are many simple things that you can do to make your home safe and to help prevent falls. If you need it, ask for help making these changes. What actions can I take to prevent falls? General information Use good lighting in all rooms. Make sure to: Replace any light bulbs that burn out. Turn on lights if it is dark and use night-lights. Keep items that you use often in easy-to-reach places. Lower the shelves around your home if needed. Move furniture so that there are clear paths around it. Do not keep throw rugs or other things on the floor that can make you trip. If  any of your floors are uneven, fix them. Add color or contrast paint or tape to clearly mark and help you see: Grab bars or handrails. First and last steps of staircases. Where the edge of each step is. If you use a ladder or stepladder: Make sure that it is fully opened. Do not climb a closed ladder. Make sure the sides of the ladder are locked in place. Have someone hold the ladder while you use it. Know where your pets are as you move through your home. What can I do in the bathroom?     Keep the floor dry. Clean up any water that is on the floor right away. Remove soap buildup in the bathtub or shower. Buildup makes bathtubs and showers slippery. Use non-skid mats or decals on the floor of the bathtub or shower. Attach bath mats securely with double-sided, non-slip rug tape. If you need to sit down while you are in the shower, use a non-slip stool. Install grab bars by the toilet and in the bathtub and shower. Do not use towel bars as grab bars. What can I do in the bedroom? Make sure that you have a light by your bed that is easy to reach. Do not use any sheets or blankets on your bed that hang to the floor. Have a firm bench or chair with side arms that you can use for support when you get dressed. What can  I do in the kitchen? Clean up any spills right away. If you need to reach something above you, use a sturdy step stool that has a grab bar. Keep electrical cables out of the way. Do not use floor polish or wax that makes floors slippery. What can I do with my stairs? Do not leave anything on the stairs. Make sure that you have a light switch at the top and the bottom of the stairs. Have them installed if you do not have them. Make sure that there are handrails on both sides of the stairs. Fix handrails that are broken or loose. Make sure that handrails are as long as the staircases. Install non-slip stair treads on all stairs in your home if they do not have carpet. Avoid  having throw rugs at the top or bottom of stairs, or secure the rugs with carpet tape to prevent them from moving. Choose a carpet design that does not hide the edge of steps on the stairs. Make sure that carpet is firmly attached to the stairs. Fix any carpet that is loose or worn. What can I do on the outside of my home? Use bright outdoor lighting. Repair the edges of walkways and driveways and fix any cracks. Clear paths of anything that can make you trip, such as tools or rocks. Add color or contrast paint or tape to clearly mark and help you see high doorway thresholds. Trim any bushes or trees on the main path into your home. Check that handrails are securely fastened and in good repair. Both sides of all steps should have handrails. Install guardrails along the edges of any raised decks or porches. Have leaves, snow, and ice cleared regularly. Use sand, salt, or ice melt on walkways during winter months if you live where there is ice and snow. In the garage, clean up any spills right away, including grease or oil spills. What other actions can I take? Review your medicines with your health care provider. Some medicines can make you confused or feel dizzy. This can increase your chance of falling. Wear closed-toe shoes that fit well and support your feet. Wear shoes that have rubber soles and low heels. Use a cane, walker, scooter, or crutches that help you move around if needed. Talk with your provider about other ways that you can decrease your risk of falls. This may include seeing a physical therapist to learn to do exercises to improve movement and strength. Where to find more information Centers for Disease Control and Prevention, STEADI: TonerPromos.no [employer] on Aging: BaseRingTones.pl National Institute on Aging: BaseRingTones.pl Contact a health care provider if: You are afraid of falling at home. You feel weak, drowsy, or dizzy at home. You fall at home. Get help right away if  you: Lose consciousness or have trouble moving after a fall. Have a fall that causes a head injury. These symptoms may be an emergency. Get help right away. Call 911. Do not wait to see if the symptoms will go away. Do not drive yourself to the hospital. This information is not intended to replace advice given to you by your health care provider. Make sure you discuss any questions you have with your health care provider. Document Revised: 12/20/2021 Document Reviewed: 12/20/2021 Elsevier Patient Education  2024 [employer].

## 2023-04-12 NOTE — Progress Notes (Signed)
Subjective:   Audrey James is a 72 y.o. female who presents for Medicare Annual (Subsequent) preventive examination.  Visit Complete: Virtual I connected with  Audrey James on 04/12/23 by a audio enabled telemedicine application and verified that I am speaking with the correct person using two identifiers.  Patient Location: Home  Provider Location: Home Office  I discussed the limitations of evaluation and management by telemedicine. The patient expressed understanding and agreed to proceed.  Vital Signs: Because this visit was a virtual/telehealth visit, some criteria may be missing or patient reported. Any vitals not documented were not able to be obtained and vitals that have been documented are patient reported.  Patient Medicare AWV questionnaire was completed by the patient on 04/11/23; I have confirmed that all information answered by patient is correct and no changes since this date.  Cardiac Risk Factors include: advanced age (>41men, >15 women);dyslipidemia     Objective:    Today's Vitals   04/12/23 0856  Weight: 179 lb (81.2 kg)  Height: 5\' 7"  (1.702 m)   Body mass index is 28.04 kg/m.     04/12/2023    9:08 AM 10/15/2021   11:01 AM 08/21/2020    3:33 PM  Advanced Directives  Does Patient Have a Medical Advance Directive? Yes Yes Yes  Type of Estate agent of [place];Living will  Healthcare Power of Attorney  Does patient want to make changes to medical advance directive? No - Patient declined    Copy of Healthcare Power of Attorney in Chart? No - copy requested      Current Medications (verified) Outpatient Encounter Medications as of 04/12/2023  Medication Sig   ARIPiprazole (ABILIFY) 5 MG tablet Take 5 mg by mouth daily.   buPROPion (WELLBUTRIN SR) 150 MG 12 hr tablet TAKE 1 TABLET BY MOUTH TWICE DAILY   cetirizine (ZYRTEC) 10 MG tablet Take 10 mg by mouth as needed for allergies.   traZODone (DESYREL) 50 MG tablet Take 100 mg by  mouth at bedtime.   naproxen sodium (ALEVE) 220 MG tablet Take 220 mg by mouth. 2 tablets (Patient not taking: Reported on 04/12/2023)   No facility-administered encounter medications on file as of 04/12/2023.    Allergies (verified) Cefaclor   History: Past Medical History:  Diagnosis Date   Anxiety    Blood transfusion without reported diagnosis    Cataract    Chronic seasonal allergic rhinitis due to pollen    and hives at times   colon cancer 1984   Headache    Heart murmur    History of colon cancer 1984   History of kidney stones    Hyperlipidemia    MDD (major depressive disorder), recurrent episode, mild (HCC)    Obesity (BMI 30.0-34.9)    Sleep disorder    Past Surgical History:  Procedure Laterality Date   APPENDECTOMY     BREAST EXCISIONAL BIOPSY Right 1999   neg   BREAST SURGERY  1999   Biopsy--negative   CATARACT EXTRACTION W/ INTRAOCULAR LENS IMPLANT Bilateral 03/2021   CESAREAN SECTION  1990   COLON SURGERY  1984   Partial colectomy   COLONOSCOPY W/ POLYPECTOMY     has them done every 5 years last one 2020   CYSTOSCOPY/URETEROSCOPY/HOLMIUM LASER/STENT PLACEMENT Left 10/22/2021   Procedure: CYSTOSCOPY/URETEROSCOPY/HOLMIUM LASER/STENT PLACEMENT;  Surgeon: Sondra Come, MD;  Location: [facility] ORS;  Service: Urology;  Laterality: Left;   EYE SURGERY     Family History  Problem Relation  Age of Onset   Hyperlipidemia Mother    Dementia Mother    Hypertension Mother    Diabetes Mother    Cancer Father    Heart disease Father    Cancer Maternal Aunt        ovarian   Ovarian cancer Maternal Aunt 50   Heart disease Paternal Aunt    Cancer Paternal Grandmother    Colon cancer Paternal Grandfather    Cancer Paternal Grandfather    Social History   Socioeconomic History   Marital status: Divorced    Spouse name: Not on file   Number of children: 1   Years of education: Not on file   Highest education level: Not on file  Occupational History    Occupation: Set designer: [employer]    Comment: part-time   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  Tobacco Use   Smoking status: Never    Passive exposure: Never   Smokeless tobacco: Never   Tobacco comments:    none  Vaping Use   Vaping status: Never Used  Substance and Sexual Activity   Alcohol use: Yes    Comment: glass of wine rare, monthly or less   Drug use: Never   Sexual activity: Not Currently    Birth control/protection: None  Other Topics Concern   Not on file  Social History Narrative   04/12/23 Works part-time at [employer]   Social Determinants of Health   Financial Resource Strain: Low Risk  (04/10/2023)   Overall Financial Resource Strain (CARDIA)    Difficulty of Paying Living Expenses: Not hard at all  Food Insecurity: No Food Insecurity (04/10/2023)   Hunger Vital Sign    Worried About Running Out of Food in the Last Year: Never true    Ran Out of Food in the Last Year: Never true  Transportation Needs: No Transportation Needs (04/10/2023)   PRAPARE - Administrator, Civil Service (Medical): No    Lack of Transportation (Non-Medical): No  Physical Activity: Unknown (04/10/2023)   Exercise Vital Sign    Days of Exercise per Week: 0 days    Minutes of Exercise per Session: Patient declined  Stress: Stress Concern Present (04/10/2023)   [employer] of Occupational Health - Occupational Stress Questionnaire    Feeling of Stress : To some extent  Social Connections: Socially Isolated (04/10/2023)   Social Connection and Isolation Panel [NHANES]    Frequency of Communication with Friends and Family: More than three times a week    Frequency of Social Gatherings with Friends and Family: Three times a week    Attends Religious Services: Never    Active Member of Clubs or Organizations: No    Attends Engineer, structural: Patient declined    Marital Status: Divorced    Tobacco  Counseling Counseling given: Not Answered Tobacco comments: none   Clinical Intake:  Pre-visit preparation completed: Yes  Pain : No/denies pain     BMI - recorded: 28.04 Nutritional Status: BMI 25 -29 Overweight Nutritional Risks: None Diabetes: No  How often do you need to have someone help you when you read instructions, pamphlets, or other written materials from your doctor or pharmacy?: 1 - Never  Interpreter Needed?: No  Information entered by :: Tora Kindred, CMA   Activities of Daily Living    04/10/2023   12:22 PM  In your present state of health, do you have any difficulty performing the  following activities:  Hearing? 0  Vision? 0  Difficulty concentrating or making decisions? 0  Walking or climbing stairs? 0  Dressing or bathing? 0  Doing errands, shopping? 0  Preparing Food and eating ? N  Using the Toilet? N  In the past six months, have you accidently leaked urine? N  Do you have problems with loss of bowel control? N  Managing your Medications? N  Managing your Finances? N  Housekeeping or managing your Housekeeping? N    Patient Care Team: Debera Lat, PA-C as PCP - General (Physician Assistant)  Indicate any recent Medical Services you may have received from other than [facility] providers in the past year (date may be approximate).     Assessment:   This is a routine wellness examination for Audrey James.  Hearing/Vision screen Hearing Screening - Comments:: Denies hearing loss Vision Screening - Comments:: Gets eye exams   Goals Addressed               This Visit's Progress     Exercise 3x per week (30 min per time) (pt-stated)        Depression Screen    04/12/2023    9:06 AM 03/02/2023    9:25 AM 05/13/2021    3:18 PM  PHQ 2/9 Scores  PHQ - 2 Score 0 2 0  PHQ- 9 Score 0 6     Fall Risk    04/10/2023   12:22 PM 03/02/2023    9:14 AM  Fall Risk   Falls in the past year? 1 0  Number falls in past yr: 0 0  Injury with Fall? 0  0  Risk for fall due to : History of fall(s)     MEDICARE RISK AT HOME: Medicare Risk at Home Any stairs in or around the home?: Yes If so, are there any without handrails?: No Home free of loose throw rugs in walkways, pet beds, electrical cords, etc?: Yes Adequate lighting in your home to reduce risk of falls?: Yes Life alert?: No Use of a cane, walker or w/c?: No Grab bars in the bathroom?: Yes Shower chair or bench in shower?: Yes Elevated toilet seat or a handicapped toilet?: No  TIMED UP AND GO:  Was the test performed?  No    Cognitive Function:        04/12/2023    9:09 AM  6CIT Screen  What Year? 0 points  What month? 0 points  What time? 0 points  Count back from 20 0 points  Months in reverse 0 points  Repeat phrase 0 points  Total Score 0 points    Immunizations Immunization History  Administered Date(s) Administered   Influenza Inj Mdck Quad Pf 02/24/2017   Influenza,inj,Quad PF,6+ Mos 03/06/2019   Influenza-Unspecified 02/20/2018, 01/29/2020, 01/31/2020   Moderna SARS-COV2 Booster Vaccination 04/03/2020   Moderna Sars-Covid-2 Vaccination 06/24/2019, 07/23/2019   Pneumococcal Conjugate-13 08/28/2019   Pneumococcal Polysaccharide-23 05/13/2021   Tdap 05/02/2013, 10/09/2017   Zoster Recombinant(Shingrix) 08/28/2019, 02/27/2020   Zoster, Live 10/30/2012    TDAP status: Up to date  Flu Vaccine status: Up to date  Pneumococcal vaccine status: Up to date  Covid-19 vaccine status: Information provided on how to obtain vaccines.   Qualifies for Shingles Vaccine? Yes   Zostavax completed Yes   Shingrix Completed?: Yes  Screening Tests Health Maintenance  Topic Date Due   Hepatitis C Screening  Never done   DEXA SCAN  03/02/2019   MAMMOGRAM  11/14/2020   COVID-19 Vaccine (  4 - 2023-24 season) 01/01/2023   Colonoscopy  07/16/2023   INFLUENZA VACCINE  07/31/2023 (Originally 12/01/2022)   Medicare Annual Wellness (AWV)  04/11/2024   DTaP/Tdap/Td  (3 - Td or Tdap) 10/10/2027   Pneumonia Vaccine 26+ Years old  Completed   Zoster Vaccines- Shingrix  Completed   HPV VACCINES  Aged Out    Health Maintenance  Health Maintenance Due  Topic Date Due   Hepatitis C Screening  Never done   DEXA SCAN  03/02/2019   MAMMOGRAM  11/14/2020   COVID-19 Vaccine (4 - 2023-24 season) 01/01/2023   Colonoscopy  07/16/2023    Colorectal cancer screening: Type of screening: Colonoscopy. Completed 07/16/18. Repeat every 5 years. Seen GI doctor and will schedule for 07/2023  Mammogram status: Ordered 09/28/22. Pt provided with contact info and advised to call to schedule appt.   Bone Density status: Ordered 04/12/23. Pt provided with contact info and advised to call to schedule appt.  Lung Cancer Screening: (Low Dose CT Chest recommended if Age 76-80 years, 20 pack-year currently smoking OR have quit w/in 15years.) does not qualify.   Lung Cancer Screening Referral: n/a  Additional Screening:  Hepatitis C Screening: does not qualify; Completed 02/17/17  Vision Screening: Recommended annual ophthalmology exams for early detection of glaucoma and other disorders of the eye.  Dental Screening: Recommended annual dental exams for proper oral hygiene   Community Resource Referral / Chronic Care Management: CRR required this visit?  No   CCM required this visit?  No     Plan:     I have personally reviewed and noted the following in the patient's chart:   Medical and social history Use of alcohol, tobacco or illicit drugs  Current medications and supplements including opioid prescriptions. Patient is not currently taking opioid prescriptions. Functional ability and status Nutritional status Physical activity Advanced directives List of other physicians Hospitalizations, surgeries, and ER visits in previous 12 months Vitals Screenings to include cognitive, depression, and falls Referrals and appointments  In addition, I have reviewed  and discussed with patient certain preventive protocols, quality metrics, and best practice recommendations. A written personalized care plan for preventive services as well as general preventive health recommendations were provided to patient.     Tora Kindred, CMA   04/12/2023   After Visit Summary: (MyChart) Due to this being a telephonic visit, the after visit summary with patients personalized plan was offered to patient via MyChart   Nurse Notes:  Placed order for a DEXA scan Reminded patient to schedule [facility] (order already in)

## 2023-04-19 ENCOUNTER — Ambulatory Visit: Payer: Medicare Other

## 2023-04-22 NOTE — Progress Notes (Unsigned)
Complete physical exam  Patient: Audrey James   DOB: 16-May-1950   72 y.o. Female  MRN: [id number] Visit Date: 04/24/2023  Today's healthcare provider: Debera Lat, PA-C   No chief complaint on file.  Subjective    Audrey James is a 72 y.o. female who presents today for a complete physical exam.  She reports consuming a {diet types:17450} diet. {Exercise:19826} She generally feels {well/fairly well/poorly:18703}. She reports sleeping {well/fairly well/poorly:18703}. She {does/does not:200015} have additional problems to discuss today.  HPI  *** Discussed the use of AI scribe software for clinical note transcription with the patient, who gave verbal consent to proceed.  History of Present Illness            Last depression screening scores    04/12/2023    9:06 AM 03/02/2023    9:25 AM 05/13/2021    3:18 PM  PHQ 2/9 Scores  PHQ - 2 Score 0 2 0  PHQ- 9 Score 0 6    Last fall risk screening    04/10/2023   12:22 PM  Fall Risk   Falls in the past year? 1  Number falls in past yr: 0  Injury with Fall? 0  Risk for fall due to : History of fall(s)   Last Audit-C alcohol use screening    04/10/2023   12:22 PM  Alcohol Use Disorder Test (AUDIT)  1. How often do you have a drink containing alcohol? 1  2. How many drinks containing alcohol do you have on a typical day when you are drinking? 0  3. How often do you have six or more drinks on one occasion? 0  AUDIT-C Score 1   A score of 3 or more in women, and 4 or more in men indicates increased risk for alcohol abuse, EXCEPT if all of the points are from question 1   Past Medical History:  Diagnosis Date  . Anxiety   . Blood transfusion without reported diagnosis   . Cataract   . Chronic seasonal allergic rhinitis due to pollen    and hives at times  . colon cancer 1984  . Headache   . Heart murmur   . History of colon cancer 1984  . History of kidney stones   . Hyperlipidemia   . MDD (major depressive  disorder), recurrent episode, mild (HCC)   . Obesity (BMI 30.0-34.9)   . Sleep disorder    Past Surgical History:  Procedure Laterality Date  . APPENDECTOMY    . BREAST EXCISIONAL BIOPSY Right 1999   neg  . BREAST SURGERY  1999   Biopsy--negative  . CATARACT EXTRACTION W/ INTRAOCULAR LENS IMPLANT Bilateral 03/2021  . CESAREAN SECTION  1990  . COLON SURGERY  1984   Partial colectomy  . COLONOSCOPY W/ POLYPECTOMY     has them done every 5 years last one 2020  . CYSTOSCOPY/URETEROSCOPY/HOLMIUM LASER/STENT PLACEMENT Left 10/22/2021   Procedure: CYSTOSCOPY/URETEROSCOPY/HOLMIUM LASER/STENT PLACEMENT;  Surgeon: Sondra Come, MD;  Location: [facility] ORS;  Service: Urology;  Laterality: Left;  . EYE SURGERY     Social History   Socioeconomic History  . Marital status: Divorced    Spouse name: Not on file  . Number of children: 1  . Years of education: Not on file  . Highest education level: Not on file  Occupational History  . Occupation: Set designer: [employer]    Comment: part-time  . 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 Smoking status: Never    Passive exposure: Never  . Smokeless tobacco: Never  . Tobacco comments:    none  Vaping Use  . Vaping status: Never Used  Substance and Sexual Activity  . Alcohol use: Yes    Comment: glass of wine rare, monthly or less  . Drug use: Never  . Sexual activity: Not Currently    Birth control/protection: None  Other Topics Concern  . Not on file  Social History Narrative   04/12/23 Works part-time at [employer]   Social Drivers of [employer]: Low Risk  (04/10/2023)   Overall Financial Resource Strain (CARDIA)   . Difficulty of Paying Living Expenses: Not hard at all  Food Insecurity: No Food Insecurity (04/10/2023)   Hunger Vital Sign   . Worried About Programme researcher, broadcasting/film/video in the Last Year: Never true   . Ran Out of Food in the Last Year: Never true   Transportation Needs: No Transportation Needs (04/10/2023)   PRAPARE - Transportation   . Lack of Transportation (Medical): No   . Lack of Transportation (Non-Medical): No  Physical Activity: Unknown (04/10/2023)   Exercise Vital Sign   . Days of Exercise per Week: 0 days   . Minutes of Exercise per Session: Patient declined  Stress: Stress Concern Present (04/10/2023)   [employer] of Occupational Health - Occupational Stress Questionnaire   . Feeling of Stress : To some extent  Social Connections: Socially Isolated (04/10/2023)   Social Connection and Isolation Panel [NHANES]   . Frequency of Communication with Friends and Family: More than three times a week   . Frequency of Social Gatherings with Friends and Family: Three times a week   . Attends Religious Services: Never   . Active Member of Clubs or Organizations: No   . Attends Banker Meetings: Patient declined   . Marital Status: Divorced  Catering manager Violence: Not At Risk (04/12/2023)   Humiliation, Afraid, Rape, and Kick questionnaire   . Fear of Current or Ex-Partner: No   . Emotionally Abused: No   . Physically Abused: No   . Sexually Abused: No   Family Status  Relation Name Status  . Mother dorothy foster Deceased  . Father glenn foster Deceased at age 45       Heart attack  . Sister  Alive  . Brother  Deceased at age 81       MVA  . Mat Aunt janet soots (Not Specified)  . Emelda Brothers ruth foster (Not Specified)  . PGM annie foster Deceased  . PGF william foster Deceased  No partnership data on file   Family History  Problem Relation Age of Onset  . Hyperlipidemia Mother   . Dementia Mother   . Hypertension Mother   . Diabetes Mother   . Cancer Father   . Heart disease Father   . Cancer Maternal Aunt        ovarian  . Ovarian cancer Maternal Aunt 50  . Heart disease Paternal Aunt   . Cancer Paternal Grandmother   . Colon cancer Paternal Grandfather   . Cancer Paternal Grandfather     Allergies  Allergen Reactions  . Cefaclor Hives    Patient Care Team: Debera Lat, Cordelia Poche as PCP - General (Physician Assistant) Gastroenterology, Deboraha Sprang (Gastroenterology) Sondra Come, MD as Consulting Physician (Urology) Azucena Kuba, [place] as Referring Physician (Ophthalmology)   Medications: Outpatient  Medications Prior to Visit  Medication Sig  . ARIPiprazole (ABILIFY) 5 MG tablet Take 5 mg by mouth daily.  Marland Kitchen buPROPion (WELLBUTRIN SR) 150 MG 12 hr tablet TAKE 1 TABLET BY MOUTH TWICE DAILY  . cetirizine (ZYRTEC) 10 MG tablet Take 10 mg by mouth as needed for allergies.  . naproxen sodium (ALEVE) 220 MG tablet Take 220 mg by mouth. 2 tablets (Patient not taking: Reported on 04/12/2023)  . traZODone (DESYREL) 50 MG tablet Take 100 mg by mouth at bedtime.   No facility-administered medications prior to visit.    Review of Systems Except see HPI  {Insert previous labs (optional):23779} {See past labs  Heme  Chem  Endocrine  Serology  Results Review (optional):1}  Objective    There were no vitals taken for this visit. {Insert last BP/Wt (optional):23777}{See vitals history (optional):1}    Physical Exam   No results found for any visits on 04/24/23.  Assessment & Plan    Routine Health Maintenance and Physical Exam  Exercise Activities and Dietary recommendations  Goals     .  Exercise 3x per week (30 min per time) (pt-stated)        Immunization History  Administered Date(s) Administered  . Influenza Inj Mdck Quad Pf 02/24/2017  . Influenza,inj,Quad PF,6+ Mos 03/06/2019  . Influenza-Unspecified 02/20/2018, 01/29/2020, 01/31/2020, 03/03/2023  . Moderna SARS-COV2 Booster Vaccination 04/03/2020  . Moderna Sars-Covid-2 Vaccination 06/24/2019, 07/23/2019  . Pneumococcal Conjugate-13 08/28/2019  . Pneumococcal Polysaccharide-23 05/13/2021  . Tdap 05/02/2013, 10/09/2017  . Zoster Recombinant(Shingrix) 08/28/2019, 02/27/2020  . Zoster, Live  10/30/2012    Health Maintenance  Topic Date Due  . DEXA scan (bone density measurement)  03/02/2019  . Mammogram  11/14/2020  . COVID-19 Vaccine (4 - 2024-25 season) 01/01/2023  . Colon Cancer Screening  07/16/2023  . Medicare Annual Wellness Visit  04/11/2024  . DTaP/Tdap/Td vaccine (3 - Td or Tdap) 10/10/2027  . Pneumonia Vaccine  Completed  . Flu Shot  Completed  . Hepatitis C Screening  Completed  . Zoster (Shingles) Vaccine  Completed  . HPV Vaccine  Aged Out    Discussed health benefits of physical activity, and encouraged her to engage in regular exercise appropriate for her age and condition.  Assessment and Plan              ***  No follow-ups on file.    The patient was advised to call back or seek an in-person evaluation if the symptoms worsen or if the condition fails to improve as anticipated.  I discussed the assessment and treatment plan with the patient. The patient was provided an opportunity to ask questions and all were answered. The patient agreed with the plan and demonstrated an understanding of the instructions.  I, Debera Lat, PA-C have reviewed all documentation for this visit. The documentation on 04/24/2023  for the exam, diagnosis, procedures, and orders are all accurate and complete.  Debera Lat, [facility], MMS [facility] [phone] (phone) [phone] (fax)  [facility]

## 2023-04-24 ENCOUNTER — Ambulatory Visit: Payer: Medicare Other | Admitting: Physician Assistant

## 2023-04-24 VITALS — BP 143/92 | HR 75 | Ht 67.0 in | Wt 182.8 lb

## 2023-04-24 DIAGNOSIS — Z0000 Encounter for general adult medical examination without abnormal findings: Secondary | ICD-10-CM

## 2023-04-24 DIAGNOSIS — Z13820 Encounter for screening for osteoporosis: Secondary | ICD-10-CM

## 2023-04-24 DIAGNOSIS — Z1231 Encounter for screening mammogram for malignant neoplasm of breast: Secondary | ICD-10-CM

## 2023-04-24 DIAGNOSIS — E2839 Other primary ovarian failure: Secondary | ICD-10-CM | POA: Diagnosis not present

## 2023-04-24 DIAGNOSIS — E7800 Pure hypercholesterolemia, unspecified: Secondary | ICD-10-CM

## 2023-04-24 DIAGNOSIS — E559 Vitamin D deficiency, unspecified: Secondary | ICD-10-CM

## 2023-04-24 DIAGNOSIS — R739 Hyperglycemia, unspecified: Secondary | ICD-10-CM

## 2023-04-24 DIAGNOSIS — R5383 Other fatigue: Secondary | ICD-10-CM | POA: Diagnosis not present

## 2023-04-24 DIAGNOSIS — R599 Enlarged lymph nodes, unspecified: Secondary | ICD-10-CM | POA: Diagnosis not present

## 2023-04-24 DIAGNOSIS — Z8639 Personal history of other endocrine, nutritional and metabolic disease: Secondary | ICD-10-CM

## 2023-04-24 DIAGNOSIS — Z780 Asymptomatic menopausal state: Secondary | ICD-10-CM

## 2023-04-24 MED ORDER — ROSUVASTATIN CALCIUM 5 MG PO TABS
5.0000 mg | ORAL_TABLET | Freq: Every day | ORAL | 1 refills | Status: AC
Start: 2023-04-24 — End: ?

## 2023-04-26 ENCOUNTER — Other Ambulatory Visit: Payer: Self-pay | Admitting: Physician Assistant

## 2023-04-26 DIAGNOSIS — R82998 Other abnormal findings in urine: Secondary | ICD-10-CM

## 2023-04-26 LAB — MICROSCOPIC EXAMINATION
Casts: NONE SEEN /[LPF]
Epithelial Cells (non renal): >10 /[HPF] — AB (ref 0–10)

## 2023-04-26 LAB — HEMOGLOBIN A1C
Est. average glucose Bld gHb Est-mCnc: 114 mg/dL
Hgb A1c MFr Bld: 5.6 % (ref 4.8–5.6)

## 2023-04-26 LAB — URINALYSIS, ROUTINE W REFLEX MICROSCOPIC
Bilirubin, UA: NEGATIVE
Glucose, UA: NEGATIVE
Ketones, UA: NEGATIVE
Nitrite, UA: NEGATIVE
Protein,UA: NEGATIVE
RBC, UA: NEGATIVE
Specific Gravity, UA: 1.025 (ref 1.005–1.030)
Urobilinogen, Ur: 0.2 mg/dL (ref 0.2–1.0)
pH, UA: 5.5 (ref 5.0–7.5)

## 2023-04-26 LAB — VITAMIN D 25 HYDROXY (VIT D DEFICIENCY, FRACTURES): Vit D, 25-Hydroxy: 23.6 ng/mL — ABNORMAL LOW (ref 30.0–100.0)

## 2023-04-30 LAB — URINE CULTURE

## 2023-05-08 ENCOUNTER — Other Ambulatory Visit: Payer: Self-pay

## 2023-05-08 DIAGNOSIS — Z87442 Personal history of urinary calculi: Secondary | ICD-10-CM

## 2023-05-09 ENCOUNTER — Ambulatory Visit (INDEPENDENT_AMBULATORY_CARE_PROVIDER_SITE_OTHER): Payer: Medicare Other | Admitting: Urology

## 2023-05-09 ENCOUNTER — Ambulatory Visit
Admission: RE | Admit: 2023-05-09 | Discharge: 2023-05-09 | Disposition: A | Discharge status: Home / Self Care | Payer: Medicare Other | Source: Ambulatory Visit | Attending: Urology

## 2023-05-09 ENCOUNTER — Ambulatory Visit
Admission: RE | Admit: 2023-05-09 | Discharge: 2023-05-09 | Disposition: A | Discharge status: Home / Self Care | Payer: Medicare Other | Attending: Urology | Admitting: Urology

## 2023-05-09 ENCOUNTER — Encounter: Payer: Self-pay | Admitting: Urology

## 2023-05-09 VITALS — BP 135/87 | Ht 67.0 in | Wt 182.4 lb

## 2023-05-09 DIAGNOSIS — Z87442 Personal history of urinary calculi: Secondary | ICD-10-CM

## 2023-05-09 DIAGNOSIS — N858 Other specified noninflammatory disorders of uterus: Secondary | ICD-10-CM | POA: Diagnosis not present

## 2023-05-09 DIAGNOSIS — N200 Calculus of kidney: Secondary | ICD-10-CM

## 2023-05-09 NOTE — Progress Notes (Signed)
   05/09/2023 1:27 PM   Audrey James [birth year] [id number]  Reason for visit: Follow up nephrolithiasis  HPI: 73 year old female who previously presented with gross hematuria in June 2023 and was found to have a 1cm left UPJ stone.  She underwent uncomplicated ureteroscopy and laser lithotripsy.  She has had no stone events or urinary problems since that time.  She is here for routine follow-up.  I personally viewed and interpreted her KUB today that shows no definite evidence of stone disease.  On her original CT from June 2023 there was an incidental finding of a 1 cm soft tissue density in the anterior aspect of the endometrial cavity, at that time I recommended a transvaginal pelvic ultrasound for further evaluation per radiology's recommendations, but this was never completed.  She was amenable to ultrasound at this time and I placed the order, she understands need for follow-up with GYN if any abnormal findings.  She recently had a routine urinalysis with PCP that showed calcium  oxalate crystals but was otherwise essentially benign.  Reassurance provided regarding negative KUB, and stone prevention strategies were discussed. We discussed general stone prevention strategies including adequate hydration with goal of producing 2.5 L of urine daily, increasing citric acid intake, increasing calcium  intake during high oxalate meals, minimizing animal protein, and decreasing salt intake. Information about dietary recommendations given today.   -She prefers surveillance KUB every 2 years -Will call with results from pelvic ultrasound for further evaluation of small uterine lesion previously seen on CT scan   Redell JAYSON Burnet, MD  [facility] [address] [place], [place] [address] [phone]

## 2023-05-12 ENCOUNTER — Ambulatory Visit (INDEPENDENT_AMBULATORY_CARE_PROVIDER_SITE_OTHER): Payer: Medicare Other | Admitting: Physician Assistant

## 2023-05-12 ENCOUNTER — Encounter: Payer: Self-pay | Admitting: Physician Assistant

## 2023-05-12 VITALS — BP 138/90 | HR 80 | Resp 18 | Ht 67.0 in | Wt 181.6 lb

## 2023-05-12 DIAGNOSIS — E559 Vitamin D deficiency, unspecified: Secondary | ICD-10-CM | POA: Insufficient documentation

## 2023-05-12 DIAGNOSIS — N200 Calculus of kidney: Secondary | ICD-10-CM | POA: Diagnosis not present

## 2023-05-12 DIAGNOSIS — E785 Hyperlipidemia, unspecified: Secondary | ICD-10-CM

## 2023-05-12 DIAGNOSIS — I10 Essential (primary) hypertension: Secondary | ICD-10-CM

## 2023-05-12 HISTORY — DX: Essential (primary) hypertension: I10

## 2023-05-12 MED ORDER — VALSARTAN 40 MG PO TABS: 40.0000 mg | ORAL_TABLET | Freq: Every day | ORAL | 3 refills | Status: DC

## 2023-05-12 NOTE — Progress Notes (Signed)
 Established patient visit  Patient: Audrey James   DOB: Sep 30, 1950   73 y.o. Female  MRN: [id number] Visit Date: 05/12/2023  Today's healthcare provider: Jolynn Spencer, PA-C   Chief Complaint  Patient presents with   Hypertension   Subjective       Discussed the use of AI scribe software for clinical note transcription with the patient, who gave verbal consent to proceed.  History of Present Illness   The patient presents with concerns about high blood pressure readings, particularly on Tuesdays when they pick up their youngest granddaughter. They attribute the elevated readings to anxiety related to driving, especially with children in the car due to concerns about other drivers.  They have been taking Vitamin D  and B12 supplements and drinking a lot of water to help manage their health. They recently saw a urologist due to concerns about kidney stones, but no stones were detected on x-ray. The urologist provided dietary advice to help prevent the formation of kidney stones.  The patient has been taking rosuvastatin  5mg  since their last visit and reports no adverse effects. They have also been taking CoQ10 with it. They have noticed an increase in their pulse rate, which they have been monitoring and recording.  They have been trying to exercise regularly, but cold weather has been a barrier. They are considering joining a gym to maintain their exercise routine regardless of the weather. They have also been advised to lose about 10% of their current weight over the next three to six months.           05/12/2023    8:12 AM 04/24/2023    8:22 AM 04/12/2023    9:06 AM  Depression screen PHQ 2/9  Decreased Interest 0 1 0  Down, Depressed, Hopeless 0 1 0  PHQ - 2 Score 0 2 0  Altered sleeping 1 1 0  Tired, decreased energy 1 2 0  Change in appetite 0 0 0  Feeling bad or failure about yourself  0 0 0  Trouble concentrating 0 0 0  Moving slowly or fidgety/restless 0 0 0   Suicidal thoughts 0 0 0  PHQ-9 Score 2 5 0  Difficult doing work/chores Not difficult at all Not difficult at all Not difficult at all      05/12/2023    8:13 AM 04/24/2023    8:22 AM 03/02/2023    9:25 AM  GAD 7 : Generalized Anxiety Score  Nervous, Anxious, on Edge 1 1 2   Control/stop worrying 1 0 1  Worry too much - different things 1 1 1   Trouble relaxing 1 0 1  Restless 0 0 0  Easily annoyed or irritable 0 0 0  Afraid - awful might happen 0 0 1  Total GAD 7 Score 4 2 6   Anxiety Difficulty Not difficult at all Not difficult at all Not difficult at all    Medications: Outpatient Medications Prior to Visit  Medication Sig   ARIPiprazole (ABILIFY) 5 MG tablet Take 5 mg by mouth daily.   buPROPion  ER (WELLBUTRIN  SR) 100 MG 12 hr tablet Take 100 mg by mouth 2 (two) times daily.   cetirizine  (ZYRTEC ) 10 MG tablet Take 10 mg by mouth as needed for allergies.   naproxen  sodium (ALEVE ) 220 MG tablet Take 220 mg by mouth. 2 tablets   rosuvastatin  (CRESTOR ) 5 MG tablet Take 1 tablet (5 mg total) by mouth daily.   traZODone (DESYREL) 50 MG tablet Take 100 mg by mouth at  bedtime.   No facility-administered medications prior to visit.    Review of Systems All negative Except see HPI       Objective    BP (!) 138/90   Pulse 80   Resp 18   Ht 5' 7 (1.702 m)   Wt 181 lb 9.6 oz (82.4 kg)   SpO2 100%   BMI 28.44 kg/m     Physical Exam Vitals reviewed.  Constitutional:      General: She is not in acute distress.    Appearance: Normal appearance. She is well-developed. She is not diaphoretic.  HENT:     Head: Normocephalic and atraumatic.  Eyes:     General: No scleral icterus.    Conjunctiva/sclera: Conjunctivae normal.  Neck:     Thyroid: No thyromegaly.  Cardiovascular:     Rate and Rhythm: Normal rate and regular rhythm.     Pulses: Normal pulses.     Heart sounds: Normal heart sounds. No murmur heard. Pulmonary:     Effort: Pulmonary effort is normal. No  respiratory distress.     Breath sounds: Normal breath sounds. No wheezing, rhonchi or rales.  Musculoskeletal:     Cervical back: Neck supple.     Right lower leg: No edema.     Left lower leg: No edema.  Lymphadenopathy:     Cervical: No cervical adenopathy.  Skin:    General: Skin is warm and dry.     Findings: No rash.  Neurological:     Mental Status: She is alert and oriented to person, place, and time. Mental status is at baseline.  Psychiatric:        Mood and Affect: Mood normal.        Behavior: Behavior normal.      No results found for any visits on 05/12/23.      Assessment and Plan    Hypertension unstable, recently diagnosed Elevated blood pressure readings, possibly related to stress. No symptoms of headache, visual disturbances, chest pain, shortness of breath, rapid heart beating, weakness, dizziness, tingling, or numbness reported. -Start on a new antihypertensive medication, initial dose halved. -Check blood pressure regularly and maintain a blood pressure journal. -Continue with current lifestyle modifications including diet and exercise. -Follow-up in 4 weeks to assess response to new medication and perform labs.  Hyperlipidemia recently diagnosed based on her 04/2023's lab results Currently on Rosuvastatin  5mg , no side effects reported. -Continue Rosuvastatin  5mg . -Check lipid profile at next visit in 4 weeks.  Vitamin D  deficiency Currently on supplementation. -Continue Vitamin D  and B12 supplementation.  Kidney stones Recent visit to urologist, no stones detected on imaging. Patient has been advised on dietary modifications to prevent stone formation. -Continue with advised dietary modifications and hydration. -Continue follow-up with urologist as needed.  General Health Maintenance -Continue taking Centrum multivitamin. -Consider weight loss of approximately 10% of current weight over the next 3-6 months. -Consider joining a gym for regular  exercise. -Consider incorporating green or white tea into diet. -Continue hydration with at least 8 glasses of water daily.      No orders of the defined types were placed in this encounter.   Return in about 4 weeks (around 06/09/2023) for BP f/u, lab work.   The patient was advised to call back or seek an in-person evaluation if the symptoms worsen or if the condition fails to improve as anticipated.  I discussed the assessment and treatment plan with the patient. The patient was provided an opportunity to ask  questions and all were answered. The patient agreed with the plan and demonstrated an understanding of the instructions.  I, Linzi Ohlinger, PA-C have reviewed all documentation for this visit. The documentation on 05/12/2023  for the exam, diagnosis, procedures, and orders are all accurate and complete.  Jolynn Spencer, [facility], MMS [facility] [phone] (phone) [phone] (fax)  [facility]

## 2023-05-23 ENCOUNTER — Ambulatory Visit: Payer: Medicare Other

## 2023-05-26 ENCOUNTER — Ambulatory Visit
Admission: RE | Admit: 2023-05-26 | Discharge: 2023-05-26 | Disposition: A | Discharge status: Home / Self Care | Payer: Medicare Other | Source: Ambulatory Visit | Attending: Urology

## 2023-05-26 DIAGNOSIS — N858 Other specified noninflammatory disorders of uterus: Secondary | ICD-10-CM | POA: Diagnosis present

## 2023-05-31 ENCOUNTER — Telehealth: Payer: Self-pay

## 2023-05-31 DIAGNOSIS — R9389 Abnormal findings on diagnostic imaging of other specified body structures: Secondary | ICD-10-CM

## 2023-05-31 NOTE — Telephone Encounter (Signed)
-----   Message from Sondra Come sent at 05/30/2023  8:03 AM EST ----- Ultrasound shows small 1 cm abnormal lesion on the uterus, needs gynecology referral to discuss next steps, thank you  Legrand Rams, MD 05/30/2023

## 2023-05-31 NOTE — Telephone Encounter (Signed)
Called pt no answer, LM for pt informing her of normal results from a urologic stand point however does need GYN referral if not established. Advised pt to contact her GYN, advised to call back if she needs a referral to one.

## 2023-06-08 NOTE — Progress Notes (Signed)
 Established patient visit  Patient: Audrey James   DOB: [birth year]   73 y.o. Female  MRN: [id number] Visit Date: 06/09/2023  Today's healthcare provider: Jolynn Spencer, PA-C   Chief Complaint  Patient presents with   Follow-up   Subjective       Discussed the use of AI scribe software for clinical note transcription with the patient, who gave verbal consent to proceed.  History of Present Illness   The patient, with a history of hypertension and hyperlipidemia, presents for a follow-up visit. She reports that her blood pressure readings have been mostly well-controlled on Valsartan  40mg  daily, taken in the morning. However, she has noticed that her blood pressure tends to be higher in the evening. She has not experienced any symptoms related to her blood pressure.  The patient also reports taking Rosuvastatin  for hyperlipidemia. She has not experienced any side effects from this medication.  In addition, the patient has a history of decreased kidney function. She reports good water intake and frequent urination. She has not noticed any leg swelling or other symptoms related to kidney function.  The patient also reports some anxiety, which she manages with Wellbutrin  and therapy. She has been experiencing some additional stress recently due to family circumstances.       you have vit D insuffiency. Please, take over -the -counter vit D 3 up to 2000 units. Your A1C is 5.6 which is within the normal limits You might have some urinary tract infection or kidney stones. Please, drink plenty of water. You need to do urine culture. I will place an order, please, drop by to our lab.     06/09/2023    8:08 AM 05/12/2023    8:12 AM 04/24/2023    8:22 AM  Depression screen PHQ 2/9  Decreased Interest 1 0 1  Down, Depressed, Hopeless 1 0 1  PHQ - 2 Score 2 0 2  Altered sleeping 1 1 1   Tired, decreased energy 0 1 2  Change in appetite 0 0 0  Feeling bad or failure about yourself  0 0 0   Trouble concentrating 0 0 0  Moving slowly or fidgety/restless 0 0 0  Suicidal thoughts 0 0 0  PHQ-9 Score 3 2 5   Difficult doing work/chores Not difficult at all Not difficult at all Not difficult at all      06/09/2023    8:09 AM 05/12/2023    8:13 AM 04/24/2023    8:22 AM 03/02/2023    9:25 AM  GAD 7 : Generalized Anxiety Score  Nervous, Anxious, on Edge 1 1 1 2   Control/stop worrying 1 1 0 1  Worry too much - different things 1 1 1 1   Trouble relaxing 0 1 0 1  Restless 0 0 0 0  Easily annoyed or irritable 0 0 0 0  Afraid - awful might happen 0 0 0 1  Total GAD 7 Score 3 4 2 6   Anxiety Difficulty Not difficult at all Not difficult at all Not difficult at all Not difficult at all    Medications: Outpatient Medications Prior to Visit  Medication Sig   ARIPiprazole (ABILIFY) 5 MG tablet Take 5 mg by mouth daily.   buPROPion  ER (WELLBUTRIN  SR) 100 MG 12 hr tablet Take 100 mg by mouth 2 (two) times daily.   cetirizine  (ZYRTEC ) 10 MG tablet Take 10 mg by mouth as needed for allergies.   naproxen  sodium (ALEVE ) 220 MG tablet Take 220 mg by mouth. 2  tablets   rosuvastatin  (CRESTOR ) 5 MG tablet Take 1 tablet (5 mg total) by mouth daily.   traZODone (DESYREL) 50 MG tablet Take 100 mg by mouth at bedtime.   valsartan  (DIOVAN ) 40 MG tablet Take 1 tablet (40 mg total) by mouth daily.   No facility-administered medications prior to visit.    Review of Systems  All other systems reviewed and are negative.  All negative Except see HPI       Objective    BP 110/80   Pulse 66   Ht 5' 7 (1.702 m)   Wt 180 lb 6.4 oz (81.8 kg)   SpO2 98%   BMI 28.25 kg/m     Physical Exam Vitals reviewed.  Constitutional:      General: She is not in acute distress.    Appearance: Normal appearance. She is well-developed. She is not diaphoretic.  HENT:     Head: Normocephalic and atraumatic.  Eyes:     General: No scleral icterus.    Conjunctiva/sclera: Conjunctivae normal.  Neck:      Thyroid: No thyromegaly.  Cardiovascular:     Rate and Rhythm: Normal rate and regular rhythm.     Pulses: Normal pulses.     Heart sounds: Normal heart sounds. No murmur heard. Pulmonary:     Effort: Pulmonary effort is normal. No respiratory distress.     Breath sounds: Normal breath sounds. No wheezing, rhonchi or rales.  Musculoskeletal:     Cervical back: Neck supple.     Right lower leg: No edema.     Left lower leg: No edema.  Lymphadenopathy:     Cervical: No cervical adenopathy.  Skin:    General: Skin is warm and dry.     Findings: No rash.  Neurological:     Mental Status: She is alert and oriented to person, place, and time. Mental status is at baseline.  Psychiatric:        Mood and Affect: Mood normal.        Behavior: Behavior normal.      No results found for any visits on 06/09/23.      Assessment and Plan    Hypertension chronic Blood pressure well-controlled in the morning but increases by the end of the day. Currently on Valsartan  40mg  in the morning. -Divide Valsartan  40mg  dose to 20mg  in the morning and 20mg  in the evening.  Hyperlipidemia On low dose Rosuvastatin . -Repeat lipid panel to assess efficacy of current dose.  Decreased Kidney Function Slight elevation of creatinine and decreased kidney function noted in the past. -Check kidney function. -Advise increased water intake (8-12 cups per day). -Avoid NSAIDs.  Anxiety Reports feeling anxious, currently on Wellbutrin . -Continue current medication. -Encourage regular exercise, relaxation techniques, and mindfulness. -Continue therapy sessions.  General Health Maintenance -Check if multivitamin contains calcium . -Encourage low sodium diet and regular exercise. -Schedule bone density test. -Reschedule mammogram. -Colonoscopy scheduled for March. -Follow-up in 6 weeks.      Orders Placed This Encounter  Procedures   Lipid panel    Has the patient fasted?:   Yes   Comprehensive  metabolic panel    Has the patient fasted?:   Yes    Return in about 6 weeks (around 07/21/2023) for chronic disease f/u, BP f/u.   The patient was advised to call back or seek an in-person evaluation if the symptoms worsen or if the condition fails to improve as anticipated.  I discussed the assessment and treatment plan with the  patient. The patient was provided an opportunity to ask questions and all were answered. The patient agreed with the plan and demonstrated an understanding of the instructions.  I, Conrado Nance, PA-C have reviewed all documentation for this visit. The documentation on 06/09/2023  for the exam, diagnosis, procedures, and orders are all accurate and complete.  Jolynn Spencer, [facility], MMS [facility] [phone] (phone) [phone] (fax)  [facility]

## 2023-06-09 ENCOUNTER — Encounter: Payer: Self-pay | Admitting: Physician Assistant

## 2023-06-09 ENCOUNTER — Ambulatory Visit (INDEPENDENT_AMBULATORY_CARE_PROVIDER_SITE_OTHER): Payer: Medicare Other | Admitting: Physician Assistant

## 2023-06-09 VITALS — BP 110/80 | HR 66 | Ht 67.0 in | Wt 180.4 lb

## 2023-06-09 DIAGNOSIS — F411 Generalized anxiety disorder: Secondary | ICD-10-CM

## 2023-06-09 DIAGNOSIS — E785 Hyperlipidemia, unspecified: Secondary | ICD-10-CM | POA: Diagnosis not present

## 2023-06-09 DIAGNOSIS — F330 Major depressive disorder, recurrent, mild: Secondary | ICD-10-CM | POA: Diagnosis not present

## 2023-06-09 DIAGNOSIS — I10 Essential (primary) hypertension: Secondary | ICD-10-CM

## 2023-06-13 ENCOUNTER — Encounter: Payer: Medicare Other | Admitting: Obstetrics and Gynecology

## 2023-06-15 ENCOUNTER — Encounter: Payer: Self-pay | Admitting: Physician Assistant

## 2023-06-15 LAB — COMPREHENSIVE METABOLIC PANEL
ALT: 16 [IU]/L (ref 0–32)
AST: 24 [IU]/L (ref 0–40)
Albumin: 4.5 g/dL (ref 3.8–4.8)
Alkaline Phosphatase: 91 [IU]/L (ref 44–121)
BUN/Creatinine Ratio: 17 (ref 12–28)
BUN: 18 mg/dL (ref 8–27)
Bilirubin Total: 0.5 mg/dL (ref 0.0–1.2)
CO2: 24 mmol/L (ref 20–29)
Calcium: 10.2 mg/dL (ref 8.7–10.3)
Chloride: 102 mmol/L (ref 96–106)
Creatinine, Ser: 1.04 mg/dL — ABNORMAL HIGH (ref 0.57–1.00)
Globulin, Total: 2.2 g/dL (ref 1.5–4.5)
Glucose: 113 mg/dL — ABNORMAL HIGH (ref 70–99)
Potassium: 4.6 mmol/L (ref 3.5–5.2)
Sodium: 140 mmol/L (ref 134–144)
Total Protein: 6.7 g/dL (ref 6.0–8.5)
eGFR: 57 mL/min/{1.73_m2} — ABNORMAL LOW (ref >59)

## 2023-06-15 LAB — LIPID PANEL
Chol/HDL Ratio: 2.8 {ratio} (ref 0.0–4.4)
Cholesterol, Total: 154 mg/dL (ref 100–199)
HDL: 56 mg/dL (ref >39)
LDL Chol Calc (NIH): 82 mg/dL (ref 0–99)
Triglycerides: 85 mg/dL (ref 0–149)
VLDL Cholesterol Cal: 16 mg/dL (ref 5–40)

## 2023-06-22 ENCOUNTER — Encounter: Payer: Medicare Other | Admitting: Obstetrics and Gynecology

## 2023-06-22 DIAGNOSIS — N859 Noninflammatory disorder of uterus, unspecified: Secondary | ICD-10-CM

## 2023-06-22 DIAGNOSIS — Z7689 Persons encountering health services in other specified circumstances: Secondary | ICD-10-CM

## 2023-07-25 ENCOUNTER — Encounter: Payer: Self-pay | Admitting: Obstetrics and Gynecology

## 2023-07-25 ENCOUNTER — Ambulatory Visit (INDEPENDENT_AMBULATORY_CARE_PROVIDER_SITE_OTHER): Payer: Medicare Other | Admitting: Obstetrics and Gynecology

## 2023-07-25 VITALS — BP 121/88 | HR 67 | Ht 67.0 in | Wt 173.3 lb

## 2023-07-25 DIAGNOSIS — N859 Noninflammatory disorder of uterus, unspecified: Secondary | ICD-10-CM | POA: Diagnosis not present

## 2023-07-25 DIAGNOSIS — Z7689 Persons encountering health services in other specified circumstances: Secondary | ICD-10-CM | POA: Diagnosis not present

## 2023-07-25 DIAGNOSIS — R9389 Abnormal findings on diagnostic imaging of other specified body structures: Secondary | ICD-10-CM | POA: Diagnosis not present

## 2023-07-25 NOTE — Progress Notes (Signed)
 HPI:      [name] is a 73 y.o. G2P0011 who LMP was No LMP recorded. Patient is postmenopausal.  Subjective:   She presents today after recently having an ultrasound as a follow-up to a "growth" that was found approximately 2 years ago as an incidental finding during a urology workup.  2 years ago she had a small mass within the endometrium that was suspected to be a fibroid at the time. Her most recent ultrasound shows the same mass and they question fibroid as it appears to be a solid mass. In addition, her most recent ultrasound shows a thickened endometrium 1.5 mm. Significant note, she has had no postmenopausal bleeding. She does report that in the past she has had an endometrial biopsy-more than 20 years ago.    Hx: The following portions of the patient's history were reviewed and updated as appropriate:             She  has a past medical history of Anxiety, Blood transfusion without reported diagnosis, Cataract, Chronic seasonal allergic rhinitis due to pollen, colon cancer (1984), Headache, Heart murmur, History of colon cancer (1984), History of kidney stones, Hyperlipidemia, Kidney stone, MDD (major depressive disorder), recurrent episode, mild (HCC), Obesity (BMI 30.0-34.9), Primary hypertension (05/12/2023), and Sleep disorder. She does not have any pertinent problems on file. She  has a past surgical history that includes Cesarean section (1990); Breast excisional biopsy (Right, 1999); Colon surgery (1984); Breast surgery (1999); Cataract extraction w/ intraocular lens implant (Bilateral, 03/2021); Colonoscopy w/ polypectomy; Cystoscopy/ureteroscopy/holmium laser/stent placement (Left, 10/22/2021); Appendectomy; and Eye surgery. Her family history includes Cancer in her father, maternal aunt, paternal grandfather, and paternal grandmother; Colon cancer in her paternal grandfather; Dementia in her mother; Diabetes in her mother; Heart disease in her father and paternal aunt;  Hyperlipidemia in her mother; Hypertension in her mother; Ovarian cancer (age of onset: 21) in her maternal aunt. She  reports that she has never smoked. She has never been exposed to tobacco smoke. She has never used smokeless tobacco. She reports current alcohol use. She reports that she does not use drugs. She has a current medication list which includes the following prescription(s): aripiprazole, cetirizine, naproxen sodium, rosuvastatin, sertraline, trazodone, and valsartan. She is allergic to cefaclor.       Review of Systems:  Review of Systems  Constitutional: Denied constitutional symptoms, night sweats, recent illness, fatigue, fever, insomnia and weight loss.  Eyes: Denied eye symptoms, eye pain, photophobia, vision change and visual disturbance.  Ears/Nose/Throat/Neck: Denied ear, nose, throat or neck symptoms, hearing loss, nasal discharge, sinus congestion and sore throat.  Cardiovascular: Denied cardiovascular symptoms, arrhythmia, chest pain/pressure, edema, exercise intolerance, orthopnea and palpitations.  Respiratory: Denied pulmonary symptoms, asthma, pleuritic pain, productive sputum, cough, dyspnea and wheezing.  Gastrointestinal: Denied, gastro-esophageal reflux, melena, nausea and vomiting.  Genitourinary: Denied genitourinary symptoms including symptomatic vaginal discharge, pelvic relaxation issues, and urinary complaints.  Musculoskeletal: Denied musculoskeletal symptoms, stiffness, swelling, muscle weakness and myalgia.  Dermatologic: Denied dermatology symptoms, rash and scar.  Neurologic: Denied neurology symptoms, dizziness, headache, neck pain and syncope.  Psychiatric: Denied psychiatric symptoms, anxiety and depression.  Endocrine: Denied endocrine symptoms including hot flashes and night sweats.   Meds:   Current Outpatient Medications on File Prior to Visit  Medication Sig Dispense Refill   ARIPiprazole (ABILIFY) 5 MG tablet Take 5 mg by mouth daily.      cetirizine (ZYRTEC) 10 MG tablet Take 10 mg by mouth as needed for allergies.  naproxen sodium (ALEVE) 220 MG tablet Take 220 mg by mouth. 2 tablets     rosuvastatin (CRESTOR) 5 MG tablet Take 1 tablet (5 mg total) by mouth daily. 90 tablet 1   sertraline (ZOLOFT) 50 MG tablet Take 50 mg by mouth daily.     traZODone (DESYREL) 50 MG tablet Take 100 mg by mouth at bedtime.     valsartan (DIOVAN) 40 MG tablet Take 1 tablet (40 mg total) by mouth daily. 90 tablet 3   No current facility-administered medications on file prior to visit.      Objective:     Vitals:   07/25/23 0803  BP: 121/88  Pulse: 67   Filed Weights   07/25/23 0803  Weight: 173 lb 4.8 oz (78.6 kg)              Images reviewed primarily and then directly with the patient          Assessment:    G2P0011 Patient Active Problem List   Diagnosis Date Noted   Vitamin D deficiency, unspecified 05/12/2023   Primary hypertension 05/12/2023   Anxiety state 04/11/2023   Family history of diabetes mellitus (DM) 03/02/2023   COVID-19 virus infection 05/26/2022   History of kidney stones 05/13/2021   Preventative health care 02/27/2020   Obesity (BMI 30.0-34.9)    MDD (major depressive disorder), recurrent episode, mild (HCC)    Sleep disorder    Hyperlipidemia    Chronic seasonal allergic rhinitis due to pollen    Osteopenia of multiple sites 03/02/2017   Mild episode of recurrent major depressive disorder (HCC) 05/08/2014   History of colon cancer 1984     1. Establishing care with new doctor, encounter for   2. Lesion of uterus   3. Thickened endometrium     We have discussed the findings at ultrasound.  It seems as if the solid mass has not changed in size and still appears to be a small fibroid. I discussed with the patient that an endometrium greater than 11 mm without postmenopausal bleeding is recommended to have a workup.  We have discussed the further workup of possible endometrial biopsy or MRI.   Patient has also been made aware that 90% of endometrial cancer causes bleeding. She has had an endometrial biopsy and does not want that again. We have discussed the possibility of anesthesia and D&C and she does not want that. She is not interested in MRI although this may be of limited usefulness in this situation for her endometrium anyway. I believe she has a low risk for endometrial cancer based on this presentation.  I believe a workup is indicated but because she is asymptomatic she has declined.   Plan:            1.  Patient has declined further workup.  I have asked her to contact us immediately if she has any bleeding and she has agreed to this.  Orders No orders of the defined types were placed in this encounter.   No orders of the defined types were placed in this encounter.     F/U  Return for Pt to contact us if symptoms worsen.  Elonda Husky, M.D. 07/25/2023 8:57 AM

## 2023-07-25 NOTE — Progress Notes (Signed)
 Patient presents today following pelvic ultrasound showing a uterine lesion. She states this ultrasound was done after an incidental finding related to a kidney stone. She states no bleeding or discomfort due to this. No additional concerns.

## 2023-07-28 ENCOUNTER — Encounter: Payer: Self-pay | Admitting: Physician Assistant

## 2023-07-28 ENCOUNTER — Ambulatory Visit (INDEPENDENT_AMBULATORY_CARE_PROVIDER_SITE_OTHER): Payer: Medicare Other | Admitting: Physician Assistant

## 2023-07-28 VITALS — BP 115/75 | HR 75 | Temp 97.2°F | Resp 16 | Ht 68.0 in | Wt 172.8 lb

## 2023-07-28 DIAGNOSIS — N289 Disorder of kidney and ureter, unspecified: Secondary | ICD-10-CM | POA: Diagnosis not present

## 2023-07-28 DIAGNOSIS — F330 Major depressive disorder, recurrent, mild: Secondary | ICD-10-CM

## 2023-07-28 DIAGNOSIS — E785 Hyperlipidemia, unspecified: Secondary | ICD-10-CM | POA: Diagnosis not present

## 2023-07-28 DIAGNOSIS — I10 Essential (primary) hypertension: Secondary | ICD-10-CM

## 2023-07-28 DIAGNOSIS — F411 Generalized anxiety disorder: Secondary | ICD-10-CM

## 2023-07-28 DIAGNOSIS — J302 Other seasonal allergic rhinitis: Secondary | ICD-10-CM

## 2023-07-28 MED ORDER — CETIRIZINE HCL 10 MG PO TABS: 10.0000 mg | ORAL_TABLET | ORAL | 1 refills | Status: AC | PRN

## 2023-07-28 NOTE — Progress Notes (Signed)
 Established patient visit  Patient: Audrey James   DOB: 1951-04-30   73 y.o. Female  MRN: [id number] Visit Date: 07/28/2023  Today's healthcare provider: Debera Lat, PA-C   Chief Complaint  Patient presents with   Hypertension    Bp f/u no other concerns   Subjective      Discussed the use of AI scribe software for clinical note transcription with the patient, who gave verbal consent to proceed.  History of Present Illness The patient, with a history of hypertension, hyperlipidemia, and anxiety, presents for a routine follow-up. She reports her blood pressure has been stable on valsartan, which she now takes once daily after previously dividing the dose. She has had no issues with her cholesterol medication. Her anxiety has been heightened over the past month due to personal issues, but she anticipates these will resolve soon.  The patient recently had a mammogram where a lesion was detected. She consulted with her OBGYN, who suspects it may be a fibroid. The patient has decided not to pursue further investigation at this time as the lesion is not bleeding and she has no spotting. She plans to monitor the situation and return to her OBGYN if any changes occur.  The patient also reports seasonal allergies, for which she takes Zyrtec. She has ordered more of this medication from [place] due to its cost-effectiveness. She reports no changes in vision.  The patient is due for a colonoscopy, but this has been delayed as she needs to arrange a ride home from the procedure. She has forgotten to schedule her mammogram and plans to do so. She will also schedule a DEXA scan at the same time as the mammogram.  Per chart review, her most recent labs showed  decreased kidney function.     07/28/2023    8:10 AM 06/09/2023    8:08 AM 05/12/2023    8:12 AM  Depression screen PHQ 2/9  Decreased Interest 2 1 0  Down, Depressed, Hopeless 1 1 0  PHQ - 2 Score 3 2 0  Altered sleeping 2 1 1   Tired,  decreased energy 2 0 1  Change in appetite 0 0 0  Feeling bad or failure about yourself  0 0 0  Trouble concentrating 0 0 0  Moving slowly or fidgety/restless 0 0 0  Suicidal thoughts 0 0 0  PHQ-9 Score 7 3 2   Difficult doing work/chores Not difficult at all Not difficult at all Not difficult at all      07/28/2023    8:11 AM 06/09/2023    8:09 AM 05/12/2023    8:13 AM 04/24/2023    8:22 AM  GAD 7 : Generalized Anxiety Score  Nervous, Anxious, on Edge 2 1 1 1   Control/stop worrying 1 1 1  0  Worry too much - different things 2 1 1 1   Trouble relaxing 2 0 1 0  Restless 2 0 0 0  Easily annoyed or irritable 1 0 0 0  Afraid - awful might happen 1 0 0 0  Total GAD 7 Score 11 3 4 2   Anxiety Difficulty Somewhat difficult Not difficult at all Not difficult at all Not difficult at all    Medications: Outpatient Medications Prior to Visit  Medication Sig   ARIPiprazole (ABILIFY) 5 MG tablet Take 5 mg by mouth daily.   naproxen sodium (ALEVE) 220 MG tablet Take 220 mg by mouth. 2 tablets   rosuvastatin (CRESTOR) 5 MG tablet Take 1 tablet (5 mg total) by  mouth daily.   sertraline (ZOLOFT) 50 MG tablet Take 50 mg by mouth daily.   traZODone (DESYREL) 50 MG tablet Take 100 mg by mouth at bedtime.   valsartan (DIOVAN) 40 MG tablet Take 1 tablet (40 mg total) by mouth daily.   [DISCONTINUED] cetirizine (ZYRTEC) 10 MG tablet Take 10 mg by mouth as needed for allergies.   No facility-administered medications prior to visit.    Review of Systems  All other systems reviewed and are negative.  All negative Except see HPI       Objective    BP 115/75 (BP Location: Left Arm, Patient Position: Sitting, Cuff Size: Normal)   Pulse 75   Temp (!) 97.2 F (36.2 C) (Oral)   Resp 16   Ht 5\' 8"  (1.727 m)   Wt 172 lb 12.8 oz (78.4 kg)   SpO2 100%   BMI 26.27 kg/m     Physical Exam Vitals reviewed.  Constitutional:      General: She is not in acute distress.    Appearance: Normal  appearance. She is well-developed. She is not diaphoretic.  HENT:     Head: Normocephalic and atraumatic.  Eyes:     General: No scleral icterus.    Conjunctiva/sclera: Conjunctivae normal.  Neck:     Thyroid: No thyromegaly.  Cardiovascular:     Rate and Rhythm: Normal rate and regular rhythm.     Pulses: Normal pulses.     Heart sounds: Normal heart sounds. No murmur heard. Pulmonary:     Effort: Pulmonary effort is normal. No respiratory distress.     Breath sounds: Normal breath sounds. No wheezing, rhonchi or rales.  Musculoskeletal:     Cervical back: Neck supple.     Right lower leg: No edema.     Left lower leg: No edema.  Lymphadenopathy:     Cervical: No cervical adenopathy.  Skin:    General: Skin is warm and dry.     Findings: No rash.  Neurological:     Mental Status: She is alert and oriented to person, place, and time. Mental status is at baseline.  Psychiatric:        Mood and Affect: Mood normal.        Behavior: Behavior normal.      No results found for any visits on 07/28/23.      Assessment and Plan Assessment & Plan Hypertension Chronic and stable Blood pressure well-controlled on valsartan 40 once daily. - Continue valsartan once daily. - Encourage periodic blood pressure monitoring. Will follow-up  Chronic Kidney Disease GFR slightly reduced, likely due to dehydration. - Encourage increased water intake. - Avoid NSAIDs; use acetaminophen. - Monitor kidney function periodically.  Hyperlipidemia chronic LDL cholesterol at 82 mg/dL, target less than 70 mg/dL. Plan to address through diet and exercise. Consider to increase crestor to 10 - Encourage dietary modifications and increased physical activity. Will follow-up  Anxiety/depression Chronic    07/28/2023    8:10 AM 06/09/2023    8:08 AM 05/12/2023    8:12 AM  PHQ9 SCORE ONLY  PHQ-9 Total Score 7 3 2       07/28/2023    8:11 AM 06/09/2023    8:09 AM 05/12/2023    8:13 AM 04/24/2023     8:22 AM  GAD 7 : Generalized Anxiety Score  Nervous, Anxious, on Edge 2 1 1 1   Control/stop worrying 1 1 1  0  Worry too much - different things 2 1 1 1   Trouble  relaxing 2 0 1 0  Restless 2 0 0 0  Easily annoyed or irritable 1 0 0 0  Afraid - awful might happen 1 0 0 0  Total GAD 7 Score 11 3 4 2   Anxiety Difficulty Somewhat difficult Not difficult at all Not difficult at all Not difficult at all   Reports increased anxiety due to personal circumstances. Managed by Dr. Maryruth Bun Currently on abilify, zoloft and trazodone Will recheck  Seasonal Allergies Currently taking Zyrtec, open to prescription for insurance coverage. Add flonase and nasal saline spray. - Prescribe Zyrtec and check insurance coverage. Will follow-up  Uterine Lesion Suspected fibroid on US pelvis/1.2cm, decision to monitor. - Monitor for changes or spotting or consider pelvic MRI, follow up if symptoms develop.  General Health Maintenance Due for routine screenings, plans to schedule once transportation is arranged. - Schedule colonoscopy once transportation is arranged. - Schedule mammogram. - Schedule DEXA scan.  Follow-up Advised follow-up in 2-3 months unless new symptoms arise. - Follow up in 2-3 months unless new symptoms develop. - Encourage communication via messaging for quicker response.    No orders of the defined types were placed in this encounter.   Return in about 3 months (around 10/28/2023) for chronic disease f/u.   The patient was advised to call back or seek an in-person evaluation if the symptoms worsen or if the condition fails to improve as anticipated.  I discussed the assessment and treatment plan with the patient. The patient was provided an opportunity to ask questions and all were answered. The patient agreed with the plan and demonstrated an understanding of the instructions.  I, Debera Lat, PA-C have reviewed all documentation for this visit. The documentation on  07/28/2023  for the exam, diagnosis, procedures, and orders are all accurate and complete.  Debera Lat, [facility], MMS [facility] [phone] (phone) [phone] (fax)  [facility]

## 2023-07-30 DIAGNOSIS — J302 Other seasonal allergic rhinitis: Secondary | ICD-10-CM | POA: Insufficient documentation

## 2023-10-02 ENCOUNTER — Other Ambulatory Visit: Payer: Self-pay | Admitting: Physician Assistant

## 2023-10-02 DIAGNOSIS — Z1231 Encounter for screening mammogram for malignant neoplasm of breast: Secondary | ICD-10-CM

## 2023-10-13 ENCOUNTER — Encounter

## 2023-10-18 ENCOUNTER — Other Ambulatory Visit: Payer: Self-pay | Admitting: Physician Assistant

## 2023-10-18 DIAGNOSIS — E7800 Pure hypercholesterolemia, unspecified: Secondary | ICD-10-CM

## 2023-10-19 ENCOUNTER — Ambulatory Visit
Admission: RE | Admit: 2023-10-19 | Discharge: 2023-10-19 | Disposition: A | Discharge status: Home / Self Care | Source: Ambulatory Visit | Attending: Physician Assistant | Admitting: Physician Assistant

## 2023-10-19 DIAGNOSIS — Z1231 Encounter for screening mammogram for malignant neoplasm of breast: Secondary | ICD-10-CM | POA: Diagnosis present

## 2023-10-23 ENCOUNTER — Ambulatory Visit: Payer: Self-pay | Admitting: Physician Assistant

## 2023-10-27 ENCOUNTER — Ambulatory Visit: Admitting: Physician Assistant

## 2023-11-22 ENCOUNTER — Other Ambulatory Visit

## 2023-11-30 ENCOUNTER — Ambulatory Visit: Admitting: Physician Assistant

## 2024-01-05 ENCOUNTER — Ambulatory Visit: Admitting: Physician Assistant

## 2024-01-09 ENCOUNTER — Ambulatory Visit (INDEPENDENT_AMBULATORY_CARE_PROVIDER_SITE_OTHER): Admitting: Physician Assistant

## 2024-01-09 ENCOUNTER — Encounter: Payer: Self-pay | Admitting: Physician Assistant

## 2024-01-09 VITALS — BP 106/80 | HR 66 | Resp 14 | Ht 68.0 in | Wt 173.2 lb

## 2024-01-09 DIAGNOSIS — Z85038 Personal history of other malignant neoplasm of large intestine: Secondary | ICD-10-CM

## 2024-01-09 DIAGNOSIS — F330 Major depressive disorder, recurrent, mild: Secondary | ICD-10-CM

## 2024-01-09 DIAGNOSIS — E785 Hyperlipidemia, unspecified: Secondary | ICD-10-CM

## 2024-01-09 DIAGNOSIS — F411 Generalized anxiety disorder: Secondary | ICD-10-CM

## 2024-01-09 DIAGNOSIS — Z23 Encounter for immunization: Secondary | ICD-10-CM

## 2024-01-09 DIAGNOSIS — J302 Other seasonal allergic rhinitis: Secondary | ICD-10-CM

## 2024-01-09 DIAGNOSIS — I10 Essential (primary) hypertension: Secondary | ICD-10-CM | POA: Diagnosis not present

## 2024-01-09 DIAGNOSIS — D259 Leiomyoma of uterus, unspecified: Secondary | ICD-10-CM

## 2024-01-09 DIAGNOSIS — N289 Disorder of kidney and ureter, unspecified: Secondary | ICD-10-CM

## 2024-01-09 MED ORDER — CETIRIZINE HCL 10 MG PO TABS: 10.0000 mg | ORAL_TABLET | Freq: Every day | ORAL | 3 refills | Status: DC

## 2024-01-09 NOTE — Progress Notes (Unsigned)
 Established patient visit  Patient: Audrey James   DOB: 12/14/1950   73 y.o. Female  MRN: [id number] Visit Date: 01/09/2024  Today's healthcare provider: Jolynn Spencer, PA-C   No chief complaint on file.  Subjective       Discussed the use of AI scribe software for clinical note transcription with the patient, who gave verbal consent to proceed.  History of Present Illness        07/28/2023    8:10 AM 06/09/2023    8:08 AM 05/12/2023    8:12 AM  Depression screen PHQ 2/9  Decreased Interest 2 1 0  Down, Depressed, Hopeless 1 1 0  PHQ - 2 Score 3 2 0  Altered sleeping 2 1 1   Tired, decreased energy 2 0 1  Change in appetite 0 0 0  Feeling bad or failure about yourself  0 0 0  Trouble concentrating 0 0 0  Moving slowly or fidgety/restless 0 0 0  Suicidal thoughts 0 0 0  PHQ-9 Score 7 3 2   Difficult doing work/chores Not difficult at all Not difficult at all Not difficult at all      07/28/2023    8:11 AM 06/09/2023    8:09 AM 05/12/2023    8:13 AM 04/24/2023    8:22 AM  GAD 7 : Generalized Anxiety Score  Nervous, Anxious, on Edge 2 1 1 1   Control/stop worrying 1 1 1  0  Worry too much - different things 2 1 1 1   Trouble relaxing 2 0 1 0  Restless 2 0 0 0  Easily annoyed or irritable 1 0 0 0  Afraid - awful might happen 1 0 0 0  Total GAD 7 Score 11 3 4 2   Anxiety Difficulty Somewhat difficult Not difficult at all Not difficult at all Not difficult at all    Medications: Outpatient Medications Prior to Visit  Medication Sig   ARIPiprazole (ABILIFY) 5 MG tablet Take 5 mg by mouth daily.   cetirizine  (ZYRTEC ) 10 MG tablet Take 1 tablet (10 mg total) by mouth as needed for allergies.   naproxen  sodium (ALEVE ) 220 MG tablet Take 220 mg by mouth. 2 tablets   rosuvastatin  (CRESTOR ) 5 MG tablet TAKE 1 TABLET BY MOUTH DAILY   sertraline (ZOLOFT) 50 MG tablet Take 50 mg by mouth daily.   traZODone (DESYREL) 50 MG tablet Take 100 mg by mouth at bedtime.   valsartan   (DIOVAN ) 40 MG tablet Take 1 tablet (40 mg total) by mouth daily.   No facility-administered medications prior to visit.    Review of Systems  All other systems reviewed and are negative.  All negative Except see HPI   {Insert previous labs (optional):23779} {See past labs  Heme  Chem  Endocrine  Serology  Results Review (optional):1}   Objective    There were no vitals taken for this visit. {Insert last BP/Wt (optional):23777}{See vitals history (optional):1}   Physical Exam Vitals reviewed.  Constitutional:      General: She is not in acute distress.    Appearance: Normal appearance. She is well-developed. She is not diaphoretic.  HENT:     Head: Normocephalic and atraumatic.  Eyes:     General: No scleral icterus.    Conjunctiva/sclera: Conjunctivae normal.  Neck:     Thyroid: No thyromegaly.  Cardiovascular:     Rate and Rhythm: Normal rate and regular rhythm.     Pulses: Normal pulses.     Heart sounds: Normal heart sounds. No  murmur heard. Pulmonary:     Effort: Pulmonary effort is normal. No respiratory distress.     Breath sounds: Normal breath sounds. No wheezing, rhonchi or rales.  Musculoskeletal:     Cervical back: Neck supple.     Right lower leg: No edema.     Left lower leg: No edema.  Lymphadenopathy:     Cervical: No cervical adenopathy.  Skin:    General: Skin is warm and dry.     Findings: No rash.  Neurological:     Mental Status: She is alert and oriented to person, place, and time. Mental status is at baseline.  Psychiatric:        Mood and Affect: Mood normal.        Behavior: Behavior normal.      No results found for any visits on 01/09/24.      Assessment and Plan Assessment & Plan     No orders of the defined types were placed in this encounter.   No follow-ups on file.   The patient was advised to call back or seek an in-person evaluation if the symptoms worsen or if the condition fails to improve as  anticipated.  I discussed the assessment and treatment plan with the patient. The patient was provided an opportunity to ask questions and all were answered. The patient agreed with the plan and demonstrated an understanding of the instructions.  I, Jordana Dugue, PA-C have reviewed all documentation for this visit. The documentation on 01/09/2024  for the exam, diagnosis, procedures, and orders are all accurate and complete.  Jolynn Spencer, [facility], MMS [facility] [phone] (phone) [phone] (fax)  [facility]

## 2024-01-10 ENCOUNTER — Ambulatory Visit: Payer: Self-pay | Admitting: Physician Assistant

## 2024-01-10 DIAGNOSIS — N1831 Chronic kidney disease, stage 3a: Secondary | ICD-10-CM

## 2024-01-10 LAB — LIPID PANEL
Chol/HDL Ratio: 3.1 ratio (ref 0.0–4.4)
Cholesterol, Total: 183 mg/dL (ref 100–199)
HDL: 59 mg/dL (ref >39)
LDL Chol Calc (NIH): 105 mg/dL — ABNORMAL HIGH (ref 0–99)
Triglycerides: 106 mg/dL (ref 0–149)
VLDL Cholesterol Cal: 19 mg/dL (ref 5–40)

## 2024-01-10 LAB — COMPREHENSIVE METABOLIC PANEL WITH GFR
ALT: 18 IU/L (ref 0–32)
AST: 21 IU/L (ref 0–40)
Albumin: 4.6 g/dL (ref 3.8–4.8)
Alkaline Phosphatase: 90 IU/L (ref 44–121)
BUN/Creatinine Ratio: 17 (ref 12–28)
BUN: 19 mg/dL (ref 8–27)
Bilirubin Total: 0.4 mg/dL (ref 0.0–1.2)
CO2: 23 mmol/L (ref 20–29)
Calcium: 9.8 mg/dL (ref 8.7–10.3)
Chloride: 104 mmol/L (ref 96–106)
Creatinine, Ser: 1.13 mg/dL — ABNORMAL HIGH (ref 0.57–1.00)
Globulin, Total: 2.3 g/dL (ref 1.5–4.5)
Glucose: 97 mg/dL (ref 70–99)
Potassium: 4.6 mmol/L (ref 3.5–5.2)
Sodium: 142 mmol/L (ref 134–144)
Total Protein: 6.9 g/dL (ref 6.0–8.5)
eGFR: 52 mL/min/1.73 — ABNORMAL LOW (ref >59)

## 2024-01-10 MED ORDER — COVID-19 MRNA VACC (MODERNA) 50 MCG/0.5ML IM SUSY: 0.5000 mL | PREFILLED_SYRINGE | Freq: Once | INTRAMUSCULAR | 0 refills | Status: AC

## 2024-01-10 NOTE — Progress Notes (Signed)
 If pt agrees to increase a dose of crestor , please, place a rx for Rosuvastatin  10mg  #90 1 refill, take 1 tab by mouth at bedtime

## 2024-01-10 NOTE — Addendum Note (Signed)
 Addended by: Wealthy Danielski on: 01/10/2024 07:37 AM   Modules accepted: Orders

## 2024-01-15 MED ORDER — ROSUVASTATIN CALCIUM 10 MG PO TABS: 10.0000 mg | ORAL_TABLET | Freq: Every day | ORAL | 1 refills | Status: AC

## 2024-01-15 NOTE — Telephone Encounter (Signed)
 Spoke with patient and she agreed to increased Statin dose. Rx sent per provider instructions.   Rosuvastatin  10mg , QHS, #90, 1 rf

## 2024-02-28 IMAGING — CT CT ABD-PEL WO/W CM
3 of 12 series · 12 of 46 positions shown, 18 images · IV contrast (agent unspecified)
Comparison: Noncontrast CT on 08/21/2020

CLINICAL DATA: Gross hematuria. Nephrolithiasis. Personal history
of colon carcinoma.

EXAM:
CT ABDOMEN AND PELVIS WITHOUT AND WITH CONTRAST
TECHNIQUE: Multidetector CT imaging of the abdomen and pelvis was performed
following the standard protocol before and following the bolus
administration of intravenous contrast.

[Series 9: axial with hematuria with 5.00 · axial · 0.94mm/px · z∈[-1547,-1197]mm · 6 of 100 slices shown, 11 images]
[im 15/100  soft-tissue]
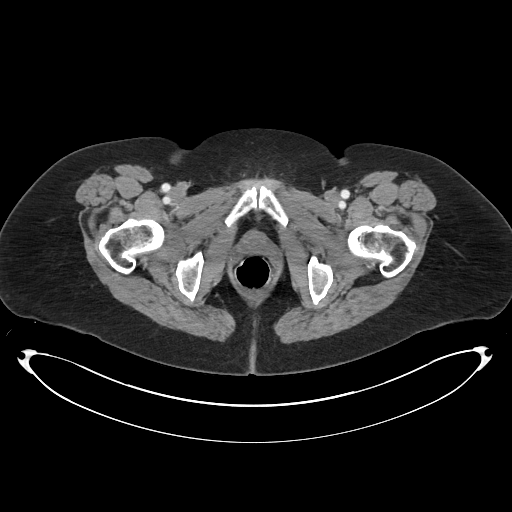
[im 15/100  bone]
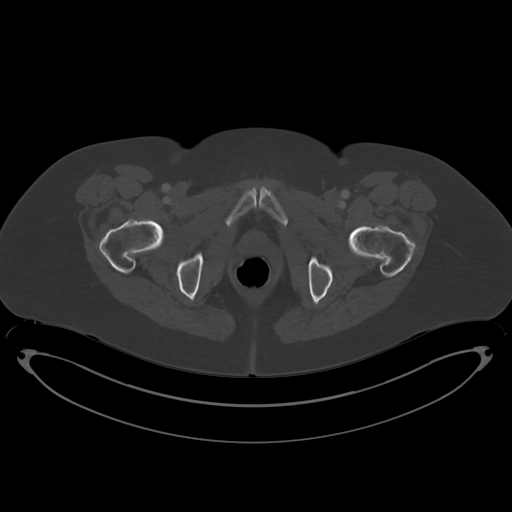
[im 29/100  soft-tissue]
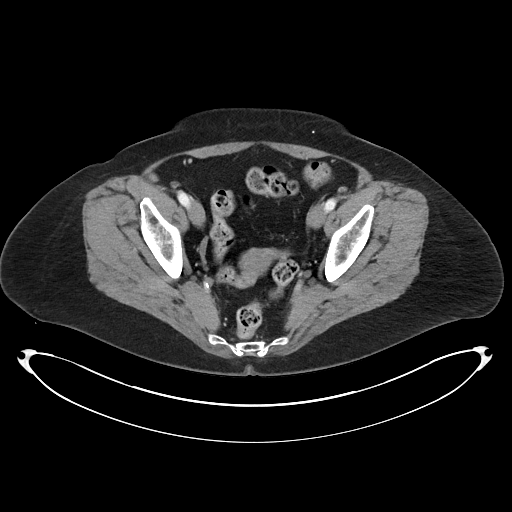
[im 43/100  soft-tissue]
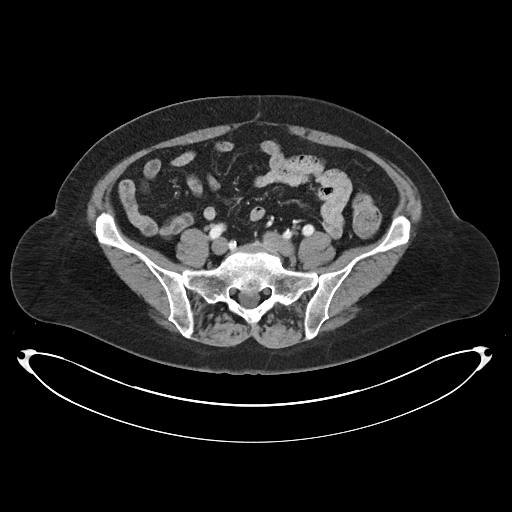
[im 43/100  lung]
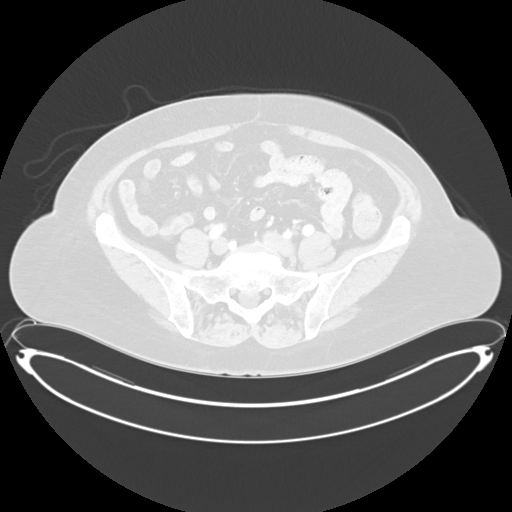
[im 57/100  soft-tissue]
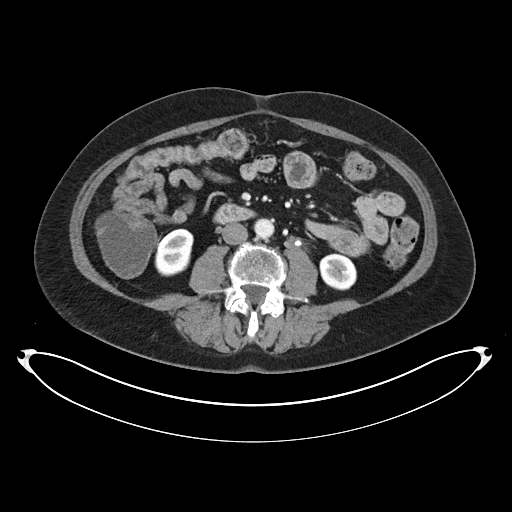
[im 57/100  lung]
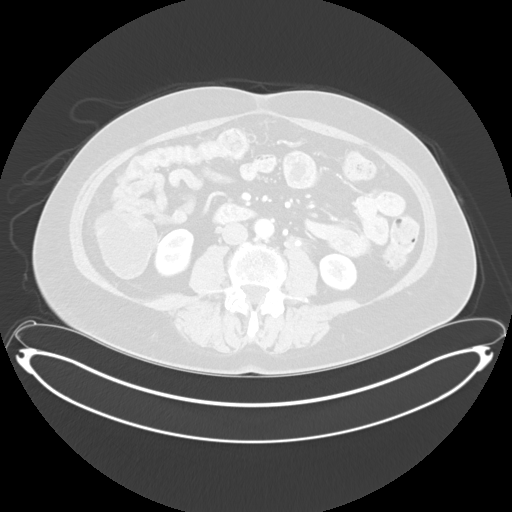
[im 71/100  soft-tissue]
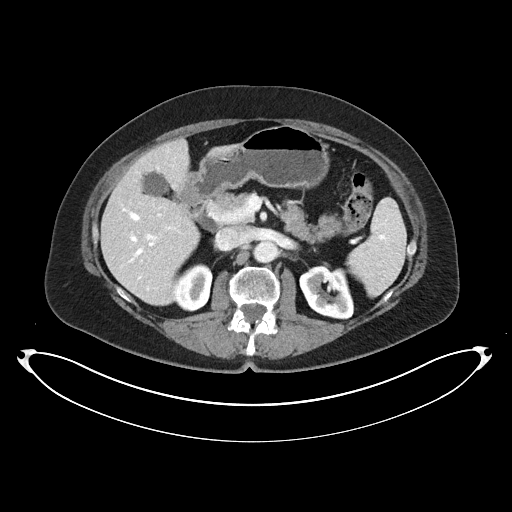
[im 71/100  lung]
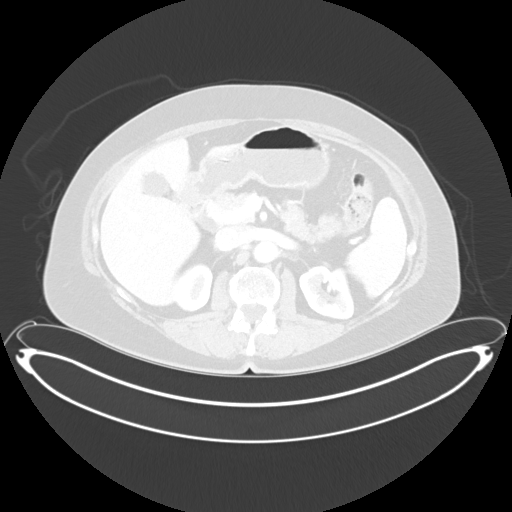
[im 85/100  soft-tissue]
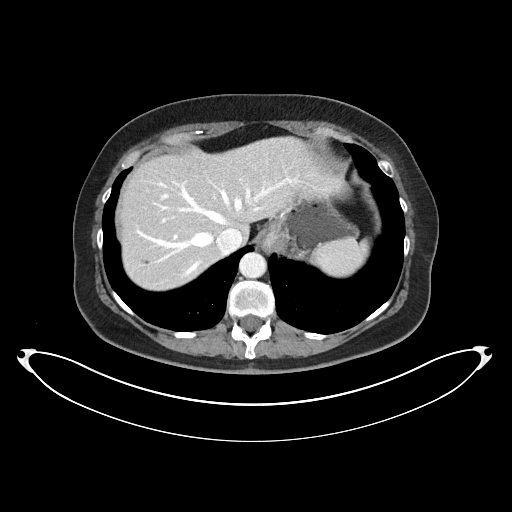
[im 85/100  lung]
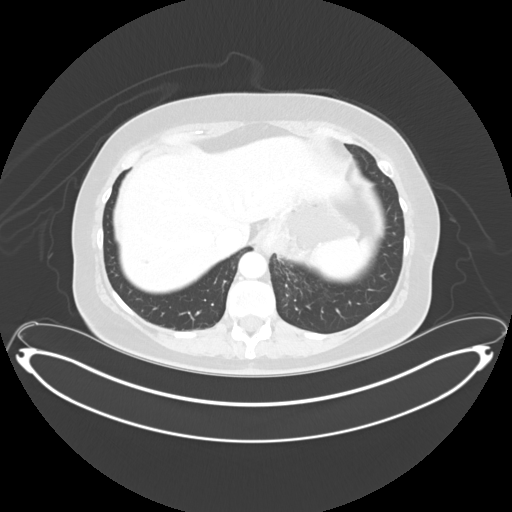

[Series 12: cor with hematuria with 2.00 cor · coronal · 0.94mm/px · 3 of 241 slices shown, 4 images]
[im 61/241  soft-tissue]
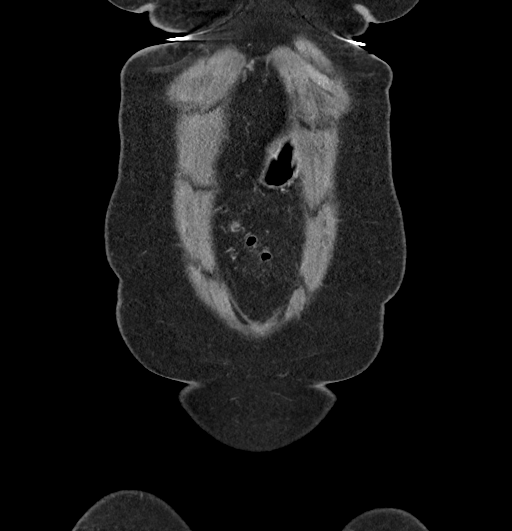
[im 121/241  soft-tissue]
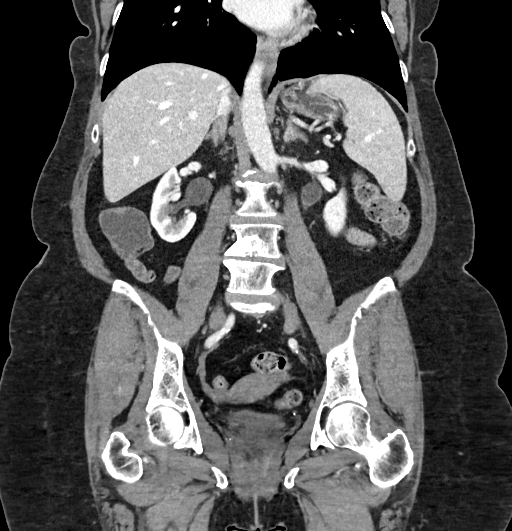
[im 121/241  bone]
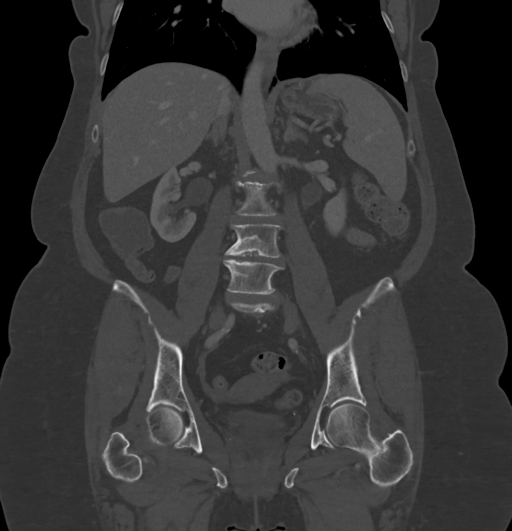
[im 181/241  soft-tissue]
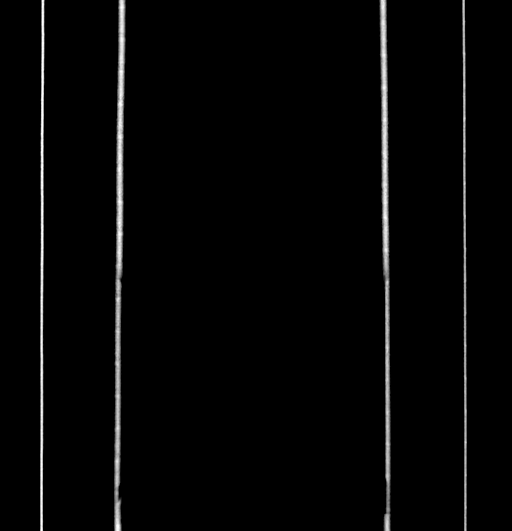

[Series 17: axial delay delay prone 5.00 · axial · delayed · 0.94mm/px · z∈[-1616,-1476]mm · 3 of 100 slices shown]
[im 15/100  soft-tissue]
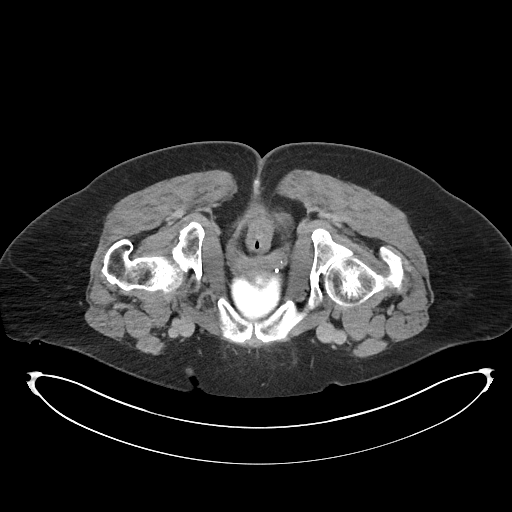
[im 29/100  soft-tissue]
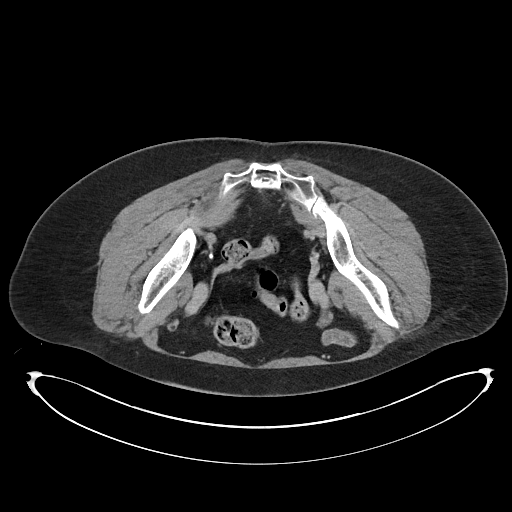
[im 43/100  soft-tissue]
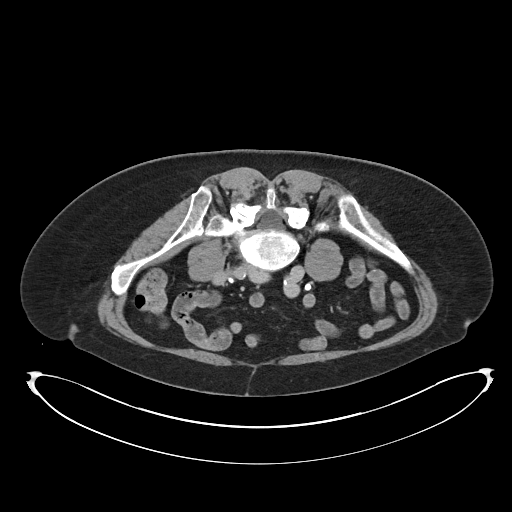

[12 of 46 positions shown; findings below may reference images not displayed]

RADIATION DOSE REDUCTION: This exam was performed according to the
departmental dose-optimization program which includes automated
exposure control, adjustment of the mA and/or kV according to
patient size and/or use of iterative reconstruction technique.

CONTRAST:  125mL OMNIPAQUE IOHEXOL 300 MG/ML  SOLN
FINDINGS: Lower Chest: No acute findings. Stable tiny sub-cm left lower lobe
pulmonary nodule, consistent with benign etiology.

Hepatobiliary: No hepatic masses identified. Tiny sub-cm cyst noted
in posterior liver dome. Gallbladder is unremarkable. No evidence of
biliary ductal dilatation.

Pancreas:  No mass or inflammatory changes.

Spleen: Within normal limits in size and appearance.

Adrenals/Urinary Tract: No adrenal masses identified. Mild left
hydronephrosis is seen due to a 7 mm calculus at the left UPJ. No
other urinary calculi identified. No renal masses identified. No
masses seen involving the collecting systems, ureters, or bladder.

Stomach/Bowel: No evidence of obstruction, inflammatory process or
abnormal fluid collections.

Vascular/Lymphatic: No pathologically enlarged lymph nodes. No acute
vascular findings. Aortic atherosclerotic calcification incidentally
noted.

Reproductive: A 1 cm soft tissue density is seen in the anterior
aspect of the endometrial cavity in the uterine corpus. This could
represent an endometrial polyp or submucosal fibroid. Adnexal
regions are unremarkable.

Other:  None.

Musculoskeletal:  No suspicious bone lesions identified.
IMPRESSION: Mild left hydronephrosis due to 7 mm calculus at the left UPJ.

No radiographic evidence of urinary tract neoplasm.

1 cm soft tissue density in anterior uterine corpus, which could
represent an endometrial polyp or submucosal fibroid. Recommend
transvaginal pelvic ultrasound for further evaluation.

Aortic Atherosclerosis ([id number]).

## 2024-03-14 NOTE — Progress Notes (Signed)
 The Chief Complaint, HPI, ROS, and PFSHx as documented were reviewed and I agree as documented, or revised as indicated.  Assessment and Plan:  Pseudophakia both eyes  - OD 02/24/2021  OS 03/10/2021 (Dr. Thurman)  PCO (posterior capsular opacification), bilateral - not visually significant.   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 Pt will reach out to PCP for neurology consult  Hyperopia/Astigmatism/Presbyopia - Refraction performed today. Finalized spectacle Rx today.   Oculoplastics next available for blepharoplasty eval  RTC 1 year comprehensive exam - Mrx, IOP, DFE   The plan outlined above was explained in detail to the patient and they expressed understanding. All questions were answered.

## 2024-03-26 ENCOUNTER — Encounter: Payer: Self-pay | Admitting: Physician Assistant

## 2024-03-26 DIAGNOSIS — R251 Tremor, unspecified: Secondary | ICD-10-CM

## 2024-04-11 ENCOUNTER — Other Ambulatory Visit: Payer: Self-pay | Admitting: Neurology

## 2024-04-11 DIAGNOSIS — R251 Tremor, unspecified: Secondary | ICD-10-CM

## 2024-04-11 DIAGNOSIS — H02402 Unspecified ptosis of left eyelid: Secondary | ICD-10-CM

## 2024-04-14 ENCOUNTER — Ambulatory Visit
Admission: RE | Admit: 2024-04-14 | Discharge: 2024-04-14 | Disposition: A | Source: Ambulatory Visit | Attending: Neurology | Admitting: Neurology

## 2024-04-14 DIAGNOSIS — R251 Tremor, unspecified: Secondary | ICD-10-CM

## 2024-04-14 DIAGNOSIS — H02402 Unspecified ptosis of left eyelid: Secondary | ICD-10-CM

## 2024-04-14 MED ORDER — GADOBUTROL 1 MMOL/ML IV SOLN
7.0000 mL | Freq: Once | INTRAVENOUS | Status: AC | PRN
  Administered 2024-04-14: 7 mL via INTRAVENOUS

## 2024-05-02 ENCOUNTER — Other Ambulatory Visit: Payer: Self-pay | Admitting: Physician Assistant

## 2024-05-02 DIAGNOSIS — I10 Essential (primary) hypertension: Secondary | ICD-10-CM

## 2024-06-07 ENCOUNTER — Ambulatory Visit: Admitting: Physician Assistant

## 2024-06-07 ENCOUNTER — Encounter: Payer: Self-pay | Admitting: Physician Assistant

## 2024-06-07 ENCOUNTER — Ambulatory Visit: Payer: Self-pay

## 2024-06-07 VITALS — BP 127/88 | HR 63 | Resp 16 | Wt 183.5 lb

## 2024-06-07 DIAGNOSIS — R29898 Other symptoms and signs involving the musculoskeletal system: Secondary | ICD-10-CM | POA: Insufficient documentation

## 2024-06-07 DIAGNOSIS — E785 Hyperlipidemia, unspecified: Secondary | ICD-10-CM

## 2024-06-07 DIAGNOSIS — R42 Dizziness and giddiness: Secondary | ICD-10-CM | POA: Insufficient documentation

## 2024-06-07 NOTE — Progress Notes (Signed)
 " Established patient visit  Patient: Audrey James   DOB: October 12, 1950   74 y.o. Female  MRN: [id number] Visit Date: 06/07/2024  Today's healthcare provider: Jolynn Spencer, PA-C   Chief Complaint  Patient presents with   Acute Visit    Patient here with concerns of leg weakness. Reports that on Wednesday her legs were shaky and she could not walk.   Subjective     HPI     Acute Visit    Additional comments: Patient here with concerns of leg weakness. Reports that on Wednesday her legs were shaky and she could not walk.      Last edited by Rosas, Joseline E, CMA on 06/07/2024  1:12 PM.       Discussed the use of AI scribe software for clinical note transcription with the patient, who gave verbal consent to proceed.  History of Present Illness Audrey James is a 74 year old female who presents with episodes of leg weakness and imbalance.  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 She became unable to walk, had to cancel the class, and colleagues called EMS. On-scene vitals and EKG were normal, and she did not lose consciousness or fall.  She has no prior similar episodes or seizures and has an upcoming neurology evaluation for possible Parkinson's disease on March 5th. She felt off balance and appeared pale during the event but denies chest pain, shortness of breath, palpitations, tingling, numbness, nausea, vomiting, visual changes, dizziness, headache, or pain.  Since the episode she has increased food and fluid intake and has had no recurrence. She is anxious about the symptoms. She reports sleeping well.  She is currently taking Crestor  and valsartan .       06/07/2024    1:17 PM 01/09/2024    8:24 AM 07/28/2023    8:10 AM  Depression screen PHQ 2/9  Decreased Interest 1 0 2  Down, Depressed, Hopeless 1 0 1  PHQ - 2 Score 2 0 3   Altered sleeping 0 0 2  Tired, decreased energy 1 0 2  Change in appetite 0 1 0  Feeling bad or failure about yourself  0 0 0  Trouble concentrating 0 0 0  Moving slowly or fidgety/restless 0 0 0  Suicidal thoughts 0 0 0  PHQ-9 Score 3 1  7    Difficult doing work/chores Not difficult at all Not difficult at all Not difficult at all     Data saved with a previous flowsheet row definition      06/07/2024    1:18 PM 07/28/2023    8:11 AM 06/09/2023    8:09 AM 05/12/2023    8:13 AM  GAD 7 : Generalized Anxiety Score  Nervous, Anxious, on Edge 2 2  1  1    Control/stop worrying 1 1  1  1    Worry too much - different things 1 2  1  1    Trouble relaxing 1 2  0  1   Restless 0 2  0  0   Easily annoyed or irritable 0 1  0  0   Afraid - awful might happen 1 1  0  0   Total GAD 7 Score 6 11 3 4   Anxiety Difficulty Somewhat difficult Somewhat difficult Not difficult at all Not difficult at all     Data saved with a previous  flowsheet row definition    Medications: Show/hide medication list[1]  Review of Systems All negative Except see HPI       Objective    BP 127/88 (BP Location: Left Arm, Patient Position: Sitting, Cuff Size: Normal)   Pulse 63   Resp 16   Wt 183 lb 8 oz (83.2 kg)   SpO2 99%   BMI 27.90 kg/m     Physical Exam Vitals reviewed.  Constitutional:      General: She is not in acute distress.    Appearance: Normal appearance. She is well-developed. She is not diaphoretic.  HENT:     Head: Normocephalic and atraumatic.  Eyes:     General: No scleral icterus.    Conjunctiva/sclera: Conjunctivae normal.  Neck:     Thyroid: No thyromegaly.  Cardiovascular:     Rate and Rhythm: Normal rate and regular rhythm.     Pulses: Normal pulses.     Heart sounds: Normal heart sounds. No murmur heard. Pulmonary:     Effort: Pulmonary effort is normal. No respiratory distress.     Breath sounds: Normal breath sounds. No wheezing, rhonchi or rales.  Musculoskeletal:      Cervical back: Neck supple.     Right lower leg: No edema.     Left lower leg: No edema.  Lymphadenopathy:     Cervical: No cervical adenopathy.  Skin:    General: Skin is warm and dry.     Findings: No rash.  Neurological:     General: No focal deficit present.     Mental Status: She is alert and oriented to person, place, and time. Mental status is at baseline.     Cranial Nerves: No cranial nerve deficit.     Coordination: Coordination normal.     Gait: Gait normal.     Deep Tendon Reflexes: Reflexes normal.  Psychiatric:        Mood and Affect: Mood normal.        Behavior: Behavior normal.        Thought Content: Thought content normal.        Judgment: Judgment normal.      No results found for any visits on 06/07/24.      Assessment & Plan Bilateral lower extremity weakness Postural dizziness with presyncope  Acute episode likely due to dehydration and inadequate nutrition. Normal neurological exam. Differential includes dehydration, electrolyte imbalance, and possible Parkinson's disease. No stroke or cardiac issues. - Ordered basic labs: electrolytes, CBC, urinalysis. - Contact neurologist for further evaluation. - Encouraged hydration >60 oz fluid daily. - Advised regular meals and meal preparation. Will follow-up  Hyperlipidemia Chronic and stable Chronic condition managed with atorvastatin. - Continue atorvastatin as prescribed. Continue lifestyle modifications Will follow-up  General Health Maintenance Discussed healthy lifestyle to prevent weakness and dizziness. - Advised balanced diet with regular meals. - Recommended carrying water for hydration.  Weakness of both lower extremities (Primary)  - CBC with Differential/Platelet - Comprehensive metabolic panel with GFR - TSH - Vitamin B12 - Lipid panel - CK (Creatine Kinase) - Urinalysis, Routine w reflex microscopic  Postural dizziness with presyncope  - CBC with Differential/Platelet -  Comprehensive metabolic panel with GFR - TSH - Vitamin B12 - Lipid panel - CK (Creatine Kinase) - Urinalysis, Routine w reflex microscopic  Hyperlipidemia, unspecified hyperlipidemia type  - CBC with Differential/Platelet - Comprehensive metabolic panel with GFR - TSH - Lipid panel   No orders of the defined types were placed in this encounter.  No follow-ups on file.   The patient was advised to call back or seek an in-person evaluation if the symptoms worsen or if the condition fails to improve as anticipated.  I discussed the assessment and treatment plan with the patient. The patient was provided an opportunity to ask questions and all were answered. The patient agreed with the plan and demonstrated an understanding of the instructions.  I, Zyheir Daft, PA-C have reviewed all documentation for this visit. The documentation on 06/07/2024  for the exam, diagnosis, procedures, and orders are all accurate and complete.  Jolynn Spencer, [facility], MMS [facility] [phone] (phone) [phone] (fax)  [facility]     [1]  Outpatient Medications Prior to Visit  Medication Sig   buPROPion  ER (WELLBUTRIN  SR) 100 MG 12 hr tablet Take 100 mg by mouth 2 (two) times daily.   cetirizine  (ZYRTEC ) 10 MG tablet Take 1 tablet (10 mg total) by mouth as needed for allergies.   rosuvastatin  (CRESTOR ) 10 MG tablet Take 1 tablet (10 mg total) by mouth at bedtime.   sertraline (ZOLOFT) 50 MG tablet Take 50 mg by mouth daily. (Patient taking differently: Take 50 mg by mouth daily. 1.5 tab daily)   traZODone (DESYREL) 50 MG tablet Take 100 mg by mouth at bedtime.   valsartan  (DIOVAN ) 40 MG tablet TAKE 1 TABLET BY MOUTH DAILY   ARIPiprazole (ABILIFY) 5 MG tablet Take 5 mg by mouth daily.   cetirizine  (ZYRTEC ) 10 MG tablet Take 1 tablet (10 mg total) by mouth daily.   No facility-administered medications prior to visit.   "

## 2024-07-09 ENCOUNTER — Ambulatory Visit: Admitting: Physician Assistant
